# Patient Record
Sex: Male | Born: 1985 | Race: White | Hispanic: No | Marital: Married | Smoking: Never smoker
Health system: Southern US, Community
[De-identification: ages and names within clinical notes are randomized; demographics above are authoritative.]

## PROBLEM LIST (undated history)

## (undated) DIAGNOSIS — F32A Depression, unspecified: Secondary | ICD-10-CM

## (undated) DIAGNOSIS — F329 Major depressive disorder, single episode, unspecified: Secondary | ICD-10-CM

## (undated) HISTORY — PX: CHOLECYSTECTOMY: SHX55

---

## 2014-03-06 ENCOUNTER — Ambulatory Visit (INDEPENDENT_AMBULATORY_CARE_PROVIDER_SITE_OTHER): Payer: BC Managed Care – PPO | Admitting: Internal Medicine

## 2014-03-06 ENCOUNTER — Encounter: Payer: Self-pay | Admitting: Internal Medicine

## 2014-03-06 VITALS — BP 110/76 | HR 76 | Temp 98.5°F | Ht 71.75 in | Wt 278.2 lb

## 2014-03-06 DIAGNOSIS — Z0000 Encounter for general adult medical examination without abnormal findings: Secondary | ICD-10-CM

## 2014-03-06 DIAGNOSIS — E669 Obesity, unspecified: Secondary | ICD-10-CM

## 2014-03-06 LAB — LIPID PANEL
Cholesterol: 202 mg/dL — ABNORMAL HIGH (ref 0–200)
HDL: 42.2 mg/dL (ref >39.00)
LDL Cholesterol: 135 mg/dL — ABNORMAL HIGH (ref 0–99)
Total CHOL/HDL Ratio: 5
Triglycerides: 126 mg/dL (ref 0.0–149.0)
VLDL: 25.2 mg/dL (ref 0.0–40.0)

## 2014-03-06 LAB — CBC
HCT: 50.2 % (ref 39.0–52.0)
HEMOGLOBIN: 17 g/dL (ref 13.0–17.0)
MCHC: 33.8 g/dL (ref 30.0–36.0)
MCV: 88.8 fl (ref 78.0–100.0)
PLATELETS: 177 10*3/uL (ref 150.0–400.0)
RBC: 5.65 Mil/uL (ref 4.22–5.81)
RDW: 12.8 % (ref 11.5–14.6)
WBC: 10.4 10*3/uL (ref 4.5–10.5)

## 2014-03-06 LAB — COMPREHENSIVE METABOLIC PANEL
ALBUMIN: 4.3 g/dL (ref 3.5–5.2)
ALT: 34 U/L (ref 0–53)
AST: 20 U/L (ref 0–37)
Alkaline Phosphatase: 76 U/L (ref 39–117)
BUN: 11 mg/dL (ref 6–23)
CALCIUM: 9.5 mg/dL (ref 8.4–10.5)
CO2: 26 meq/L (ref 19–32)
CREATININE: 1 mg/dL (ref 0.4–1.5)
Chloride: 107 mEq/L (ref 96–112)
GFR: 93.4 mL/min (ref >60.00)
Glucose, Bld: 66 mg/dL — ABNORMAL LOW (ref 70–99)
Potassium: 4.1 mEq/L (ref 3.5–5.1)
Sodium: 139 mEq/L (ref 135–145)
Total Bilirubin: 1.4 mg/dL — ABNORMAL HIGH (ref 0.3–1.2)
Total Protein: 7.4 g/dL (ref 6.0–8.3)

## 2014-03-06 LAB — TSH: TSH: 0.7 u[IU]/mL (ref 0.35–5.50)

## 2014-03-06 LAB — HEMOGLOBIN A1C: HEMOGLOBIN A1C: 5.3 % (ref 4.6–6.5)

## 2014-03-06 NOTE — Progress Notes (Signed)
Pt presents to the clinic today to establish care. He recently moved from [place]. He has not had a PCP in the last few years.  1- He reports that he tends to get sick with some sort of illness, viral, diarrhea, strep throat multiple times per year. He is not sure why he has such a hard time fighting off infections.  Flu: occasionally none in 2014 Tetanus: 2010? Dentist: as needed  History reviewed. No pertinent past medical history.  No current outpatient prescriptions on file.   No current facility-administered medications for this visit.    No Known Allergies  Family History  Problem Relation Age of Onset  . Cancer Maternal Grandmother     breast  . Cancer Maternal Grandfather     prostate  . Diabetes Neg Hx   . Heart disease Neg Hx   . Stroke Neg Hx     History   Social History  . Marital Status: Married    Spouse Name: N/A    Number of Children: N/A  . Years of Education: N/A   Occupational History  . Not on file.   Social History Main Topics  . Smoking status: Never Smoker   . Smokeless tobacco: Never Used  . Alcohol Use: No  . Drug Use: No  . Sexual Activity: Not on file   Other Topics Concern  . Not on file   Social History Narrative  . No narrative on file    ROS:  Constitutional: Denies fever, malaise, fatigue, headache or abrupt weight changes.  HEENT: Denies eye pain, eye redness, ear pain, ringing in the ears, wax buildup, runny nose, nasal congestion, bloody nose, or sore throat. Respiratory: Denies difficulty breathing, shortness of breath, cough or sputum production.   Cardiovascular: Denies chest pain, chest tightness, palpitations or swelling in the hands or feet.  Gastrointestinal: Denies abdominal pain, bloating, constipation, diarrhea or blood in the stool.  GU: Denies frequency, urgency, pain with urination, blood in urine, odor or discharge. Musculoskeletal: Denies decrease in range of motion, difficulty with gait, muscle pain  or joint pain and swelling.  Skin: Denies redness, rashes, lesions or ulcercations.  Neurological: Denies dizziness, difficulty with memory, difficulty with speech or problems with balance and coordination.   No other specific complaints in a complete review of systems (except as listed in HPI above).  PE:  BP 110/76  Pulse 76  Temp(Src) 98.5 F (36.9 C) (Oral)  Ht 5' 11.75" (1.822 m)  Wt 278 lb 4 oz (126.213 kg)  BMI 38.02 kg/m2 Wt Readings from Last 3 Encounters:  03/06/14 278 lb 4 oz (126.213 kg)    General: Appears his stated age, obese but well developed, well nourished in NAD. HEENT: Head: normal shape and size; Eyes: sclera white, no icterus, conjunctiva pink, PERRLA and EOMs intact; Ears: Tm's gray and intact, normal light reflex; Nose: mucosa pink and moist, septum midline; Throat/Mouth: Teeth present, mucosa pink and moist, no lesions or ulcerations noted.  Neck: Normal range of motion. Neck supple, trachea midline. No massses, lumps or thyromegaly present.  Cardiovascular: Normal rate and rhythm. S1,S2 noted.  No murmur, rubs or gallops noted. No JVD or BLE edema. No carotid bruits noted. Pulmonary/Chest: Normal effort and positive vesicular breath sounds. No respiratory distress. No wheezes, rales or ronchi noted.  Abdomen: Soft and nontender. Normal bowel sounds, no bruits noted. No distention or masses noted. Liver, spleen and kidneys non palpable. Musculoskeletal: Normal range of motion. No signs of joint swelling. No  difficulty with gait.  Neurological: Alert and oriented. Cranial nerves II-XII intact. Coordination normal. +DTRs bilaterally. Psychiatric: Mood and affect normal. Behavior is normal. Judgment and thought content normal.      Assessment and Plan:  Prevent Health Maintenance:  He declines flu shot today Will check basic screening labs Encouraged him to visit a dentist on a more frequent basis  RTC in 1 year or sooner if needed

## 2014-03-06 NOTE — Patient Instructions (Addendum)

## 2014-03-06 NOTE — Assessment & Plan Note (Signed)
Encouraged him to work on diet and exercise 

## 2014-03-06 NOTE — Progress Notes (Signed)
Pre visit review using our clinic review tool, if applicable. No additional management support is needed unless otherwise documented below in the visit note. 

## 2014-03-21 ENCOUNTER — Ambulatory Visit: Payer: Self-pay | Admitting: Family Medicine

## 2014-03-21 ENCOUNTER — Telehealth: Payer: Self-pay

## 2014-03-21 NOTE — Telephone Encounter (Signed)
Pt was seen 03/06/14 and advised to cb if had further abd pain; pt said last night developed sharp rt sided abd pain under ribcage that comes and goes; continuing pain this morning. Pain level now is 6 but when has severe pain; pain level is 8-9. No nausea or vomiting and pt is not sure if has fever or not. Pt said Nicki Reaperegina Baity NP had mentioned if continued pain might have US done. Pt request cb ASAP. If condition changes or worsens prior to cb pt will cb or go to UC.

## 2014-03-21 NOTE — Telephone Encounter (Signed)
Ok, he needs to follow up and at that time we can order ultrasound

## 2014-03-21 NOTE — Telephone Encounter (Signed)
Ok either way, he needs to follow up if it is still a concern. If he brings the report that would be helpful

## 2014-03-21 NOTE — Telephone Encounter (Signed)
Pt states he went to UC at [facility] and they ordered the Abd US and they told pt that it did shown inflammation of his gall bladder but no stones--they are awaiting actual report--

## 2014-04-14 ENCOUNTER — Ambulatory Visit: Payer: Self-pay | Admitting: Surgery

## 2014-04-17 LAB — PATHOLOGY REPORT

## 2014-08-31 ENCOUNTER — Encounter: Payer: Self-pay | Admitting: Internal Medicine

## 2014-08-31 ENCOUNTER — Ambulatory Visit (INDEPENDENT_AMBULATORY_CARE_PROVIDER_SITE_OTHER): Payer: BC Managed Care – PPO | Admitting: Internal Medicine

## 2014-08-31 VITALS — BP 122/84 | HR 74 | Temp 98.1°F | Wt 283.0 lb

## 2014-08-31 DIAGNOSIS — R05 Cough: Secondary | ICD-10-CM

## 2014-08-31 DIAGNOSIS — J069 Acute upper respiratory infection, unspecified: Secondary | ICD-10-CM

## 2014-08-31 DIAGNOSIS — R059 Cough, unspecified: Secondary | ICD-10-CM

## 2014-08-31 MED ORDER — HYDROCODONE-HOMATROPINE 5-1.5 MG/5ML PO SYRP: 5.0000 mL | ORAL_SOLUTION | Freq: Three times a day (TID) | ORAL | Status: DC | PRN

## 2014-08-31 NOTE — Progress Notes (Signed)
Subjective:    Patient ID: William Cruz, male    DOB: [birth year], 28 y.o.   MRN: [id number]  HPI  Patient presents with sore throat and productive cough with thick green sputum for the past three days. He has taken ibuprofen with some relief. He reports possible history of seasonal allergies.  Review of Systems   History reviewed. No pertinent past medical history.  Current Outpatient Prescriptions  Medication Sig Dispense Refill  . HYDROcodone-homatropine (HYCODAN) 5-1.5 MG/5ML syrup Take 5 mLs by mouth every 8 (eight) hours as needed for cough.  120 mL  0   No current facility-administered medications for this visit.    No Known Allergies  Family History  Problem Relation Age of Onset  . Cancer Maternal Grandmother     breast  . Cancer Maternal Grandfather     prostate  . Diabetes Neg Hx   . Heart disease Neg Hx   . Stroke Neg Hx     History   Social History  . Marital Status: Married    Spouse Name: N/A    Number of Children: N/A  . Years of Education: N/A   Occupational History  . Not on file.   Social History Main Topics  . Smoking status: Never Smoker   . Smokeless tobacco: Never Used  . Alcohol Use: No  . Drug Use: Yes  . Sexual Activity: Not on file   Other Topics Concern  . Not on file   Social History Narrative  . No narrative on file     Constitutional: Denies fever, malaise, fatigue,  or abrupt weight changes.  HEENT: Denies eye pain, eye redness, ear pain, ringing in the ears, wax buildup, runny nose, nasal congestion, bloody nose, or . Respiratory: Denies difficulty breathing, shortness of breath, .   Cardiovascular: Denies chest pain, chest tightness, palpitations or swelling in the hands or feet.     No other specific complaints in a complete review of systems (except as listed in HPI above).     Objective:   Physical Exam  BP 122/84  Pulse 74  Temp(Src) 98.1 F (36.7 C) (Oral)  Wt 283 lb (128.368 kg)  SpO2 98% Wt Readings  from Last 3 Encounters:  08/31/14 283 lb (128.368 kg)  03/06/14 278 lb 4 oz (126.213 kg)    General: Appears his stated age, well developed, well nourished in NAD. Marland KitchenHEENT: Ears: Tm's gray and intact, normal light reflex; both ear canals erythematous.Nose: mucosa pink and moist, septum midline; Throat/Mouth: Pharynx is erythematous.  Neck: Supple, no lymphadenopathy noted.  Cardiovascular: Normal rate and rhythm. S1,S2 noted.  No murmur, rubs or gallops noted.  Pulmonary/Chest: Normal effort and positive vesicular breath sounds. No respiratory distress. No wheezes, rales or ronchi noted.   BMET    Component Value Date/Time   NA 139 03/06/2014 1123   K 4.1 03/06/2014 1123   CL 107 03/06/2014 1123   CO2 26 03/06/2014 1123   GLUCOSE 66* 03/06/2014 1123   BUN 11 03/06/2014 1123   CREATININE 1.0 03/06/2014 1123   CALCIUM 9.5 03/06/2014 1123    Lipid Panel     Component Value Date/Time   CHOL 202* 03/06/2014 1123   TRIG 126.0 03/06/2014 1123   HDL 42.20 03/06/2014 1123   CHOLHDL 5 03/06/2014 1123   VLDL 25.2 03/06/2014 1123   LDLCALC 135* 03/06/2014 1123    CBC    Component Value Date/Time   WBC 10.4 03/06/2014 1123   RBC 5.65 03/06/2014 1123  HGB 17.0 03/06/2014 1123   HCT 50.2 03/06/2014 1123   PLT 177.0 03/06/2014 1123   MCV 88.8 03/06/2014 1123   MCHC 33.8 03/06/2014 1123   RDW 12.8 03/06/2014 1123    Hgb A1C Lab Results  Component Value Date   HGBA1C 5.3 03/06/2014         Assessment & Plan:  Cough - Plan: HYDROcodone-homatropine (HYCODAN) 5-1.5 MG/5ML syrup  Viral URI  Zyrtec, Flonase, Mucinex Plenty of liquids Please call if symptoms worsen or do not improve.

## 2014-08-31 NOTE — Patient Instructions (Signed)
Cough, Adult  A cough is a reflex that helps clear your throat and airways. It can help heal the body or may be a reaction to an irritated airway. A cough may only last 2 or 3 weeks (acute) or may last more than 8 weeks (chronic).  CAUSES Acute cough:  Viral or bacterial infections. Chronic cough:  Infections.  Allergies.  Asthma.  Post-nasal drip.  Smoking.  Heartburn or acid reflux.  Some medicines.  Chronic lung problems (COPD).  Cancer. SYMPTOMS   Cough.  Fever.  Chest pain.  Increased breathing rate.  High-pitched whistling sound when breathing (wheezing).  Colored mucus that you cough up (sputum). TREATMENT   A bacterial cough may be treated with antibiotic medicine.  A viral cough must run its course and will not respond to antibiotics.  Your caregiver may recommend other treatments if you have a chronic cough. HOME CARE INSTRUCTIONS   Only take over-the-counter or prescription medicines for pain, discomfort, or fever as directed by your caregiver. Use cough suppressants only as directed by your caregiver.  Use a cold steam vaporizer or humidifier in your bedroom or home to help loosen secretions.  Sleep in a semi-upright position if your cough is worse at night.  Rest as needed.  Stop smoking if you smoke. SEEK IMMEDIATE MEDICAL CARE IF:   You have pus in your sputum.  Your cough starts to worsen.  You cannot control your cough with suppressants and are losing sleep.  You begin coughing up blood.  You have difficulty breathing.  You develop pain which is getting worse or is uncontrolled with medicine.  You have a fever. MAKE SURE YOU:   Understand these instructions.  Will watch your condition.  Will get help right away if you are not doing well or get worse. Document Released: 05/30/2011 Document Revised: 02/23/2012 Document Reviewed: 05/30/2011 ExitCare Patient Information 2015 [employer]. This information is not intended  to replace advice given to you by your health care provider. Make sure you discuss any questions you have with your health care provider.  

## 2014-08-31 NOTE — Progress Notes (Signed)
HPI  Pt presents to the clinic today with c/o sore throat, cough and headache. He reports this started 3 days ago. He has had some difficulty swallowing. The cough is productive of thick green mucous. The cough is keeping him up in the past. He denies fever, chills or body aches. He has tried Ibuprofen OTC without much relief. He has no history of seasonal allergies or breathing problems. He has  had sick contacts at work. He does not smoke  Review of Systems     No past medical history on file.  Family History  Problem Relation Age of Onset  . Cancer Maternal Grandmother     breast  . Cancer Maternal Grandfather     prostate  . Diabetes Neg Hx   . Heart disease Neg Hx   . Stroke Neg Hx     History   Social History  . Marital Status: Married    Spouse Name: N/A    Number of Children: N/A  . Years of Education: N/A   Occupational History  . Not on file.   Social History Main Topics  . Smoking status: Never Smoker   . Smokeless tobacco: Never Used  . Alcohol Use: No  . Drug Use: No  . Sexual Activity: Not on file   Other Topics Concern  . Not on file   Social History Narrative  . No narrative on file    No Known Allergies   Constitutional: Positive headache. Denies fatigue, fever or abrupt weight changes.  HEENT:  Positive sore throat. Denies eye redness, eye pain, pressure behind the eyes, facial pain, nasal congestion, ear pain, ringing in the ears, wax buildup, runny nose or bloody nose. Respiratory: Positive cough. Denies difficulty breathing or shortness of breath.  Cardiovascular: Denies chest pain, chest tightness, palpitations or swelling in the hands or feet.   No other specific complaints in a complete review of systems (except as listed in HPI above).  Objective:  BP 122/84  Pulse 74  Temp(Src) 98.1 F (36.7 C) (Oral)  Wt 283 lb (128.368 kg)  SpO2 98%  Wt Readings from Last 3 Encounters:  08/31/14 283 lb (128.368 kg)  03/06/14 278 lb 4 oz  (126.213 kg)     General: Appears his stated age, well developed, well nourished in NAD. HEENT:  Ears: Tm's red but intact, normal light reflex; Nose: mucosa pink and moist, septum midline; Throat/Mouth:  Teeth present, mucosa erythematous and moist, no exudate noted, no lesions or ulcerations noted.  Cardiovascular: Normal rate and rhythm. S1,S2 noted.  No murmur, rubs or gallops noted.  Pulmonary/Chest: Normal effort and positive vesicular breath sounds. No respiratory distress. No wheezes, rales or ronchi noted.      Assessment & Plan:   Viral Upper Respiratory Infection:  Get some rest and drink plenty of water Do salt water gargles and continue Ibuprofen for the sore throat Take Claritin, Flonase and Mucinex OTC eRx for Hycodan cough syrup QHS  RTC as needed or if symptoms persist.

## 2014-08-31 NOTE — Progress Notes (Signed)
Pre visit review using our clinic review tool, if applicable. No additional management support is needed unless otherwise documented below in the visit note. 

## 2015-04-07 NOTE — Op Note (Signed)
PATIENT NAME:  William Cruz, William Cruz MR#:  [id number] DATE OF BIRTH:  03-26-1986  DATE OF PROCEDURE:  04/14/2014  PREOPERATIVE DIAGNOSIS: Chronic cholecystitis.   POSTOPERATIVE DIAGNOSIS: Chronic cholecystitis.   PROCEDURE: Laparoscopic cholecystectomy, cholangiogram.   SURGEON: Adella Hare, MD  ANESTHESIA: General.   INDICATIONS: This 29 year old male has a history of right upper abdominal pain. He had an abnormal ultrasound and the gallbladder was partially contracted and had thickening of the gallbladder wall at 3.8 mm, and surgery was recommended for definitive treatment.   DESCRIPTION OF PROCEDURE: The patient was placed on the operating table in the supine position under general endotracheal anesthesia. The abdomen was prepared with ChloraPrep and draped in a sterile manner.   A short incision was made in the inferior aspect of the umbilicus and carried down to the deep fascia, which was grasped with laryngeal hook and elevated. A Veress William Cruz was inserted, aspirated and irrigated with a saline solution. Next, the peritoneal cavity was inflated with carbon dioxide. The Veress William Cruz was removed. The 10 mm cannula was inserted. The 10 mm 0 degree laparoscope was inserted to view the peritoneal cavity. The liver appeared smooth. Most of what could be seen at this point was omentum. Next, another incision was made in the epigastrium, slightly to the right of the midline to introduce an 11 mm cannula. Two incisions were made in the lateral aspect of the right upper quadrant. The lowermost incision at this site had some bleeding, and this bleeding point was cauterized. Two 5 mm cannulas were inserted.   With the patient in the reverse Trendelenburg position turned several degrees to the left, the gallbladder was grasped with 5 mm grasper. There were adhesions surrounding the gallbladder, which were taken down with blunt and sharp dissection, and it appeared that with the distance from the umbilicus  to the gallbladder, visibility at this point was suboptimal and elected to change to the 25 degree scope, which the gallbladder could be seen better as the omentum was dissected away from the gallbladder. The gallbladder was also encased in fat, and there was a somewhat tedious dissection undertaken to incise and peel the fat off of the gallbladder. Next, the gallbladder neck was grasped with a 5 mm grasper and retracted inferiorly and laterally. The location of the porta hepatis was demonstrated. The cystic duct was dissected free from surrounding structures. The cystic artery was dissected free from surrounding structures. The gallbladder neck was mobilized further with incision of the visceral peritoneum. A critical view of safety was demonstrated. An Endo Clip was placed across the cystic duct adjacent to the neck of the gallbladder. An incision was made in the cystic duct, which there was some bleeding which was cauterized. Subsequently, the Reddick catheter was inserted, and the cholangiogram was done with injection of half-strength Conray 60 dye. The cholangiogram demonstrated somewhat small bile ducts. There was reflux into the liver, and there was flow into the duodenum. No retained stones were seen. The Reddick catheter was removed. The cystic duct was doubly ligated with Endo Clips and divided. The cystic artery was controlled with Endo Clips and divided. The gallbladder was dissected free from the liver with use of hook and cautery and blunt dissection. There was another vessel along the posterior aspect of the gallbladder bed which was controlled with a clip. The gallbladder was further mobilized from the liver and completely separated. A small amount of blood was aspirated. Hemostasis was intact. The gallbladder was brought up through the infraumbilical  incision, opened and suctioned, removed and submitted in formalin for routine pathology. The right upper quadrant was further inspected. Hemostasis was  intact. The cannulas were removed, allowing carbon dioxide to escape from the peritoneal cavity. Skin incisions were closed with interrupted 5-0 chromic subcuticular sutures, benzoin and Steri-Strips. Dressings were applied with paper tape. The patient tolerated surgery satisfactorily and was then prepared for transfer to the recovery room.  ____________________________ Shela CommonsJ. Renda RollsWilton Smith, MD jws:lb D: 04/14/2014 09:31:16 ET T: 04/14/2014 09:41:02 ET JOB#: [id number]  cc: Adella HareJ. Wilton Smith, MD, <Dictator> Adella HareWILTON J SMITH MD ELECTRONICALLY SIGNED 04/21/2014 12:50

## 2015-12-04 ENCOUNTER — Ambulatory Visit (INDEPENDENT_AMBULATORY_CARE_PROVIDER_SITE_OTHER): Payer: BLUE CROSS/BLUE SHIELD | Admitting: Internal Medicine

## 2015-12-04 ENCOUNTER — Ambulatory Visit
Admission: RE | Admit: 2015-12-04 | Discharge: 2015-12-04 | Disposition: A | Discharge status: Home / Self Care | Payer: BLUE CROSS/BLUE SHIELD | Source: Ambulatory Visit | Attending: Internal Medicine | Admitting: Internal Medicine

## 2015-12-04 ENCOUNTER — Encounter: Payer: Self-pay | Admitting: Internal Medicine

## 2015-12-04 VITALS — BP 122/72 | HR 72 | Temp 98.4°F | Wt 286.0 lb

## 2015-12-04 DIAGNOSIS — M545 Low back pain, unspecified: Secondary | ICD-10-CM

## 2015-12-04 MED ORDER — METHOCARBAMOL 500 MG PO TABS: 500.0000 mg | ORAL_TABLET | Freq: Every evening | ORAL | Status: DC | PRN
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
## 2015-12-04 NOTE — Patient Instructions (Signed)

## 2015-12-04 NOTE — Progress Notes (Signed)
Pre visit review using our clinic review tool, if applicable. No additional management support is needed unless otherwise documented below in the visit note. 

## 2015-12-19 ENCOUNTER — Emergency Department: Payer: BLUE CROSS/BLUE SHIELD

## 2015-12-19 ENCOUNTER — Emergency Department
Admission: EM | Admit: 2015-12-19 | Discharge: 2015-12-19 | Disposition: A | Discharge status: Home / Self Care | Payer: BLUE CROSS/BLUE SHIELD | Attending: Student | Admitting: Student

## 2015-12-19 ENCOUNTER — Telehealth: Payer: Self-pay | Admitting: Internal Medicine

## 2015-12-19 ENCOUNTER — Other Ambulatory Visit: Payer: Self-pay

## 2015-12-19 DIAGNOSIS — Y9389 Activity, other specified: Secondary | ICD-10-CM | POA: Insufficient documentation

## 2015-12-19 DIAGNOSIS — S0990XA Unspecified injury of head, initial encounter: Secondary | ICD-10-CM | POA: Diagnosis not present

## 2015-12-19 DIAGNOSIS — Y92410 Unspecified street and highway as the place of occurrence of the external cause: Secondary | ICD-10-CM | POA: Diagnosis not present

## 2015-12-19 DIAGNOSIS — Y998 Other external cause status: Secondary | ICD-10-CM | POA: Diagnosis not present

## 2015-12-19 DIAGNOSIS — R55 Syncope and collapse: Secondary | ICD-10-CM | POA: Diagnosis not present

## 2015-12-19 DIAGNOSIS — S199XXA Unspecified injury of neck, initial encounter: Secondary | ICD-10-CM | POA: Insufficient documentation

## 2015-12-19 LAB — URINALYSIS COMPLETE WITH MICROSCOPIC (ARMC ONLY)
BILIRUBIN URINE: NEGATIVE
Bacteria, UA: NONE SEEN
GLUCOSE, UA: NEGATIVE mg/dL
Ketones, ur: NEGATIVE mg/dL
LEUKOCYTES UA: NEGATIVE
Nitrite: NEGATIVE
Protein, ur: NEGATIVE mg/dL
pH: 5 (ref 5.0–8.0)

## 2015-12-19 LAB — BASIC METABOLIC PANEL
Anion gap: 7 (ref 5–15)
BUN: 11 mg/dL (ref 6–20)
CHLORIDE: 109 mmol/L (ref 101–111)
CO2: 24 mmol/L (ref 22–32)
Calcium: 9.3 mg/dL (ref 8.9–10.3)
Creatinine, Ser: 0.9 mg/dL (ref 0.61–1.24)
GFR calc non Af Amer: >60 mL/min (ref >60)
Glucose, Bld: 95 mg/dL (ref 65–99)
Potassium: 4.2 mmol/L (ref 3.5–5.1)
SODIUM: 140 mmol/L (ref 135–145)

## 2015-12-19 LAB — TROPONIN I

## 2015-12-19 LAB — GLUCOSE, CAPILLARY: Glucose-Capillary: 84 mg/dL (ref 65–99)

## 2015-12-19 LAB — CBC
HEMATOCRIT: 49.7 % (ref 40.0–52.0)
HEMOGLOBIN: 16.8 g/dL (ref 13.0–18.0)
MCH: 28.8 pg (ref 26.0–34.0)
MCHC: 33.7 g/dL (ref 32.0–36.0)
MCV: 85.3 fL (ref 80.0–100.0)
Platelets: 182 10*3/uL (ref 150–440)
RBC: 5.83 MIL/uL (ref 4.40–5.90)
RDW: 13.1 % (ref 11.5–14.5)
WBC: 9.8 10*3/uL (ref 3.8–10.6)

## 2015-12-19 MED ORDER — SODIUM CHLORIDE 0.9 % IV BOLUS (SEPSIS)
500.0000 mL | Freq: Once | INTRAVENOUS | Status: AC
  Administered 2015-12-19: 500 mL via INTRAVENOUS

## 2015-12-19 MED ORDER — SODIUM CHLORIDE 0.9 % IV BOLUS (SEPSIS)
1000.0000 mL | Freq: Once | INTRAVENOUS | Status: AC
Start: 2015-12-19 — End: 2015-12-19
  Administered 2015-12-19: 1000 mL via INTRAVENOUS

## 2015-12-19 MED ORDER — IOHEXOL 350 MG/ML SOLN
100.0000 mL | Freq: Once | INTRAVENOUS | Status: AC | PRN
  Administered 2015-12-19: 80 mL via INTRAVENOUS

## 2015-12-19 NOTE — Telephone Encounter (Signed)
Per chart review pt is at [facility] ED now. 
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
## 2015-12-19 NOTE — ED Notes (Signed)
Patient transported to X-ray 

## 2016-01-22 ENCOUNTER — Encounter: Payer: Self-pay | Admitting: Internal Medicine

## 2016-01-22 ENCOUNTER — Ambulatory Visit (INDEPENDENT_AMBULATORY_CARE_PROVIDER_SITE_OTHER): Payer: BLUE CROSS/BLUE SHIELD | Admitting: Internal Medicine

## 2016-01-22 VITALS — BP 124/86 | HR 86 | Temp 98.0°F | Wt 287.0 lb

## 2016-01-22 DIAGNOSIS — F32A Depression, unspecified: Secondary | ICD-10-CM

## 2016-01-22 DIAGNOSIS — F329 Major depressive disorder, single episode, unspecified: Secondary | ICD-10-CM | POA: Diagnosis not present

## 2016-01-22 MED ORDER — ESCITALOPRAM OXALATE 10 MG PO TABS: 10.0000 mg | ORAL_TABLET | Freq: Every day | ORAL | Status: DC

## 2016-01-22 NOTE — Patient Instructions (Signed)
Major Depressive Disorder Major depressive disorder is a mental illness. It also may be called clinical depression or unipolar depression. Major depressive disorder usually causes feelings of sadness, hopelessness, or helplessness. Some people with this disorder do not feel particularly sad but lose interest in doing things they used to enjoy (anhedonia). Major depressive disorder also can cause physical symptoms. It can interfere with work, school, relationships, and other normal everyday activities. The disorder varies in severity but is longer lasting and more serious than the sadness we all feel from time to time in our lives. Major depressive disorder often is triggered by stressful life events or major life changes. Examples of these triggers include divorce, loss of your job or home, a move, and the death of a family member or close friend. Sometimes this disorder occurs for no obvious reason at all. People who have family members with major depressive disorder or bipolar disorder are at higher risk for developing this disorder, with or without life stressors. Major depressive disorder can occur at any age. It may occur just once in your life (single episode major depressive disorder). It may occur multiple times (recurrent major depressive disorder). SYMPTOMS People with major depressive disorder have either anhedonia or depressed mood on nearly a daily basis for at least 2 weeks or longer. Symptoms of depressed mood include:  Feelings of sadness (blue or down in the dumps) or emptiness.  Feelings of hopelessness or helplessness.  Tearfulness or episodes of crying (may be observed by others).  Irritability (children and adolescents). In addition to depressed mood or anhedonia or both, people with this disorder have at least four of the following symptoms:  Difficulty sleeping or sleeping too much.   Significant change (increase or decrease) in appetite or weight.   Lack of energy or  motivation.  Feelings of guilt and worthlessness.   Difficulty concentrating, remembering, or making decisions.  Unusually slow movement (psychomotor retardation) or restlessness (as observed by others).   Recurrent wishes for death, recurrent thoughts of self-harm (suicide), or a suicide attempt. People with major depressive disorder commonly have persistent negative thoughts about themselves, other people, and the world. People with severe major depressive disorder may experiencedistorted beliefs or perceptions about the world (psychotic delusions). They also may see or hear things that are not real (psychotic hallucinations). DIAGNOSIS Major depressive disorder is diagnosed through an assessment by your health care provider. Your health care provider will ask aboutaspects of your daily life, such as mood,sleep, and appetite, to see if you have the diagnostic symptoms of major depressive disorder. Your health care provider may ask about your medical history and use of alcohol or drugs, including prescription medicines. Your health care provider also may do a physical exam and blood work. This is because certain medical conditions and the use of certain substances can cause major depressive disorder-like symptoms (secondary depression). Your health care provider also may refer you to a mental health specialist for further evaluation and treatment. TREATMENT It is important to recognize the symptoms of major depressive disorder and seek treatment. The following treatments can be prescribed for this disorder:   Medicine. Antidepressant medicines usually are prescribed. Antidepressant medicines are thought to correct chemical imbalances in the brain that are commonly associated with major depressive disorder. Other types of medicine may be added if the symptoms do not respond to antidepressant medicines alone or if psychotic delusions or hallucinations occur.  Talk therapy. Talk therapy can be  helpful in treating major depressive disorder by providing   support, education, and guidance. Certain types of talk therapy also can help with negative thinking (cognitive behavioral therapy) and with relationship issues that trigger this disorder (interpersonal therapy). A mental health specialist can help determine which treatment is best for you. Most people with major depressive disorder do well with a combination of medicine and talk therapy. Treatments involving electrical stimulation of the brain can be used in situations with extremely severe symptoms or when medicine and talk therapy do not work over time. These treatments include electroconvulsive therapy, transcranial magnetic stimulation, and vagal nerve stimulation.   This information is not intended to replace advice given to you by your health care provider. Make sure you discuss any questions you have with your health care provider.   Document Released: 03/28/2013 Document Revised: 12/22/2014 Document Reviewed: 03/28/2013 Elsevier Interactive Patient Education 2016 [employer] Inc.  
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
## 2016-01-22 NOTE — Progress Notes (Signed)
Pre visit review using our clinic review tool, if applicable. No additional management support is needed unless otherwise documented below in the visit note. 

## 2016-01-23 ENCOUNTER — Telehealth: Payer: Self-pay

## 2016-01-23 NOTE — Telephone Encounter (Signed)
pts wife (DPR signed) left v/m requesting cb about med that was prescribed on 01/22/16. Left v/m requesting cb.

## 2016-02-13 NOTE — Telephone Encounter (Signed)
Lessie Dings said at this time pt is seeing another doctor as well and that doctor is reviewing med; if anything further needed Lessie Dings will cb.

## 2016-02-19 ENCOUNTER — Ambulatory Visit (INDEPENDENT_AMBULATORY_CARE_PROVIDER_SITE_OTHER): Payer: BLUE CROSS/BLUE SHIELD | Admitting: Internal Medicine

## 2016-02-19 ENCOUNTER — Telehealth: Payer: Self-pay | Admitting: Internal Medicine

## 2016-02-19 ENCOUNTER — Encounter: Payer: Self-pay | Admitting: Internal Medicine

## 2016-02-19 ENCOUNTER — Encounter (HOSPITAL_COMMUNITY): Payer: Self-pay | Admitting: *Deleted

## 2016-02-19 VITALS — BP 124/78 | HR 77 | Temp 97.9°F | Wt 287.0 lb

## 2016-02-19 DIAGNOSIS — Z79899 Other long term (current) drug therapy: Secondary | ICD-10-CM | POA: Insufficient documentation

## 2016-02-19 DIAGNOSIS — F22 Delusional disorders: Secondary | ICD-10-CM

## 2016-02-19 DIAGNOSIS — R45851 Suicidal ideations: Secondary | ICD-10-CM | POA: Diagnosis present

## 2016-02-19 DIAGNOSIS — F329 Major depressive disorder, single episode, unspecified: Secondary | ICD-10-CM | POA: Insufficient documentation

## 2016-02-19 DIAGNOSIS — F32A Depression, unspecified: Secondary | ICD-10-CM

## 2016-02-19 LAB — CBC
HCT: 46.9 % (ref 39.0–52.0)
HEMOGLOBIN: 16.2 g/dL (ref 13.0–17.0)
MCH: 29.9 pg (ref 26.0–34.0)
MCHC: 34.5 g/dL (ref 30.0–36.0)
MCV: 86.5 fL (ref 78.0–100.0)
PLATELETS: 187 10*3/uL (ref 150–400)
RBC: 5.42 MIL/uL (ref 4.22–5.81)
RDW: 12.6 % (ref 11.5–15.5)
WBC: 11 10*3/uL — AB (ref 4.0–10.5)

## 2016-02-19 LAB — COMPREHENSIVE METABOLIC PANEL
ALBUMIN: 3.7 g/dL (ref 3.5–5.0)
ALT: 25 U/L (ref 17–63)
AST: 19 U/L (ref 15–41)
Alkaline Phosphatase: 77 U/L (ref 38–126)
Anion gap: 10 (ref 5–15)
BUN: 10 mg/dL (ref 6–20)
CHLORIDE: 108 mmol/L (ref 101–111)
CO2: 23 mmol/L (ref 22–32)
Calcium: 9.2 mg/dL (ref 8.9–10.3)
Creatinine, Ser: 1.03 mg/dL (ref 0.61–1.24)
Glucose, Bld: 125 mg/dL — ABNORMAL HIGH (ref 65–99)
POTASSIUM: 3.7 mmol/L (ref 3.5–5.1)
SODIUM: 141 mmol/L (ref 135–145)
Total Bilirubin: 1.5 mg/dL — ABNORMAL HIGH (ref 0.3–1.2)
Total Protein: 6.7 g/dL (ref 6.5–8.1)

## 2016-02-19 LAB — RAPID URINE DRUG SCREEN, HOSP PERFORMED
AMPHETAMINES: NOT DETECTED
BENZODIAZEPINES: NOT DETECTED
Barbiturates: NOT DETECTED
COCAINE: NOT DETECTED
OPIATES: NOT DETECTED
Tetrahydrocannabinol: NOT DETECTED

## 2016-02-19 LAB — ACETAMINOPHEN LEVEL

## 2016-02-19 LAB — ETHANOL

## 2016-02-19 LAB — SALICYLATE LEVEL

## 2016-02-19 NOTE — Telephone Encounter (Signed)
Noted, will discuss at upcoming appt 

## 2016-02-19 NOTE — Patient Instructions (Signed)
Major Depressive Disorder Major depressive disorder is a mental illness. It also may be called clinical depression or unipolar depression. Major depressive disorder usually causes feelings of sadness, hopelessness, or helplessness. Some people with this disorder do not feel particularly sad but lose interest in doing things they used to enjoy (anhedonia). Major depressive disorder also can cause physical symptoms. It can interfere with work, school, relationships, and other normal everyday activities. The disorder varies in severity but is longer lasting and more serious than the sadness we all feel from time to time in our lives. Major depressive disorder often is triggered by stressful life events or major life changes. Examples of these triggers include divorce, loss of your job or home, a move, and the death of a family member or close friend. Sometimes this disorder occurs for no obvious reason at all. People who have family members with major depressive disorder or bipolar disorder are at higher risk for developing this disorder, with or without life stressors. Major depressive disorder can occur at any age. It may occur just once in your life (single episode major depressive disorder). It may occur multiple times (recurrent major depressive disorder). SYMPTOMS People with major depressive disorder have either anhedonia or depressed mood on nearly a daily basis for at least 2 weeks or longer. Symptoms of depressed mood include:  Feelings of sadness (blue or down in the dumps) or emptiness.  Feelings of hopelessness or helplessness.  Tearfulness or episodes of crying (may be observed by others).  Irritability (children and adolescents). In addition to depressed mood or anhedonia or both, people with this disorder have at least four of the following symptoms:  Difficulty sleeping or sleeping too much.   Significant change (increase or decrease) in appetite or weight.   Lack of energy or  motivation.  Feelings of guilt and worthlessness.   Difficulty concentrating, remembering, or making decisions.  Unusually slow movement (psychomotor retardation) or restlessness (as observed by others).   Recurrent wishes for death, recurrent thoughts of self-harm (suicide), or a suicide attempt. People with major depressive disorder commonly have persistent negative thoughts about themselves, other people, and the world. People with severe major depressive disorder may experiencedistorted beliefs or perceptions about the world (psychotic delusions). They also may see or hear things that are not real (psychotic hallucinations). DIAGNOSIS Major depressive disorder is diagnosed through an assessment by your health care provider. Your health care provider will ask aboutaspects of your daily life, such as mood,sleep, and appetite, to see if you have the diagnostic symptoms of major depressive disorder. Your health care provider may ask about your medical history and use of alcohol or drugs, including prescription medicines. Your health care provider also may do a physical exam and blood work. This is because certain medical conditions and the use of certain substances can cause major depressive disorder-like symptoms (secondary depression). Your health care provider also may refer you to a mental health specialist for further evaluation and treatment. TREATMENT It is important to recognize the symptoms of major depressive disorder and seek treatment. The following treatments can be prescribed for this disorder:   Medicine. Antidepressant medicines usually are prescribed. Antidepressant medicines are thought to correct chemical imbalances in the brain that are commonly associated with major depressive disorder. Other types of medicine may be added if the symptoms do not respond to antidepressant medicines alone or if psychotic delusions or hallucinations occur.  Talk therapy. Talk therapy can be  helpful in treating major depressive disorder by providing   support, education, and guidance. Certain types of talk therapy also can help with negative thinking (cognitive behavioral therapy) and with relationship issues that trigger this disorder (interpersonal therapy). A mental health specialist can help determine which treatment is best for you. Most people with major depressive disorder do well with a combination of medicine and talk therapy. Treatments involving electrical stimulation of the brain can be used in situations with extremely severe symptoms or when medicine and talk therapy do not work over time. These treatments include electroconvulsive therapy, transcranial magnetic stimulation, and vagal nerve stimulation.   This information is not intended to replace advice given to you by your health care provider. Make sure you discuss any questions you have with your health care provider.   Document Released: 03/28/2013 Document Revised: 12/22/2014 Document Reviewed: 03/28/2013 Elsevier Interactive Patient Education 2016 [employer] Inc.  

## 2016-02-19 NOTE — ED Notes (Signed)
Staffing notified of need for sitter.  

## 2016-02-19 NOTE — Progress Notes (Signed)
Subjective:    Patient ID: William Cruz, male    DOB: [birth year], 30 y.o.   MRN: [id number]  HPI  Pt presents to the clinic today for 1 month follow up of depression. He was started on Lexapro at his last visit. He reports he was slightly dizzy after he started the medication but that seemed to wear off. He reports he realized this morning that he had not taking his medication in 2 days. He was dizzy and felt nausea. [place] was under the influence. The cop called his wife and advised her to come pick him up. He thinks he is feeling this way because he forgot to take his medication. He did establish with a psychologist, Dr. Beckey Downing at [facility]. He recommended that he see their psychiatrist on staff, and  has appt set up for next week.His wife reports ongoing delusions. He still feels depressed, but does feel it is better. He denies SI/HI.  Review of Systems  History reviewed. No pertinent past medical history.  Current Outpatient Prescriptions  Medication Sig Dispense Refill  . escitalopram (LEXAPRO) 10 MG tablet Take 1 tablet (10 mg total) by mouth at bedtime. 30 tablet 2  . ibuprofen (ADVIL,MOTRIN) 200 MG tablet Take 600 mg by mouth every 6 (six) hours as needed.     No current facility-administered medications for this visit.    No Known Allergies  Family History  Problem Relation Age of Onset  . Cancer Maternal Grandmother     breast  . Cancer Maternal Grandfather     prostate  . Diabetes Neg Hx   . Heart disease Neg Hx   . Stroke Neg Hx     Social History   Social History  . Marital Status: Married    Spouse Name: N/A  . Number of Children: N/A  . Years of Education: N/A   Occupational History  . Not on file.   Social History Main Topics  . Smoking status: Never Smoker   . Smokeless tobacco: Never Used  . Alcohol Use: No  . Drug Use: Yes  . Sexual Activity: Yes   Other Topics Concern  .  Not on file   Social History Narrative     Constitutional: Denies fever, malaise, fatigue, headache or abrupt weight changes.  Respiratory: Denies difficulty breathing, shortness of breath, cough or sputum production.   Cardiovascular: Denies chest pain, chest tightness, palpitations or swelling in the hands or feet.  Neurological: Denies dizziness, difficulty with memory, difficulty with speech or problems with balance and coordination.  Psych: Pt reports depression, delusions. Denies anxiety, SI/HI.  No other specific complaints in a complete review of systems (except as listed in HPI above).     Objective:   Physical Exam  BP 124/78 mmHg  Pulse 77  Temp(Src) 97.9 F (36.6 C) (Oral)  Wt 287 lb (130.182 kg)  SpO2 98% Wt Readings from Last 3 Encounters:  02/19/16 287 lb (130.182 kg)  01/22/16 287 lb (130.182 kg)  12/19/15 284 lb (128.822 kg)    General: Appears his stated age, obese in NAD. Cardiovascular: Normal rate and rhythm. S1,S2 noted.  No murmur, rubs or gallops noted.  Pulmonary/Chest: Normal effort and positive vesicular breath sounds. No respiratory distress. No wheezes, rales or ronchi noted.  Neurological: Alert and oriented.  Psychiatric: He does seem a little paranoid today. Affect is normal.  BMET    Component Value Date/Time   NA  140 12/19/2015 0909   K 4.2 12/19/2015 0909   CL 109 12/19/2015 0909   CO2 24 12/19/2015 0909   GLUCOSE 95 12/19/2015 0909   BUN 11 12/19/2015 0909   CREATININE 0.90 12/19/2015 0909   CALCIUM 9.3 12/19/2015 0909   GFRNONAA >60 12/19/2015 0909   GFRAA >60 12/19/2015 0909    Lipid Panel     Component Value Date/Time   CHOL 202* 03/06/2014 1123   TRIG 126.0 03/06/2014 1123   HDL 42.20 03/06/2014 1123   CHOLHDL 5 03/06/2014 1123   VLDL 25.2 03/06/2014 1123   LDLCALC 135* 03/06/2014 1123    CBC    Component Value Date/Time   WBC 9.8 12/19/2015 0909   RBC 5.83 12/19/2015 0909   HGB 16.8 12/19/2015 0909   HCT 49.7  12/19/2015 0909   PLT 182 12/19/2015 0909   MCV 85.3 12/19/2015 0909   MCH 28.8 12/19/2015 0909   MCHC 33.7 12/19/2015 0909   RDW 13.1 12/19/2015 0909    Hgb A1C Lab Results  Component Value Date   HGBA1C 5.3 03/06/2014         Assessment & Plan:   Depression with delusional disorder:  Concerning for bipolar disorder He already has an appt with psychiatry Continue Lexapro until evaluated by psych Advised him if he becomes acutely delusional, psychotic or has SI/HI, to call 911 or go to nearest ER  Will follow up after psych evaluation

## 2016-02-19 NOTE — ED Notes (Signed)
Pt c/o suicidal thoughts, int x 1 month. States his plan was to overdose on muscle relaxers.

## 2016-02-19 NOTE — Progress Notes (Signed)
Pre visit review using our clinic review tool, if applicable. No additional management support is needed unless otherwise documented below in the visit note. 

## 2016-02-19 NOTE — Telephone Encounter (Signed)
Pt's wife states pt has not been taking medication---pt just called wife and he got pulled over due to speeding--pt has been nauseous, dizzy and has been missing work. Pt reports pt seems to be "out of it"--wife reports pt has been acting as if things are going on that are not truly happening---altered mental status as "dreaming" bringing into real life

## 2016-02-19 NOTE — ED Notes (Signed)
Pt normally sees [facility].

## 2016-02-19 NOTE — ED Notes (Signed)
States he was recently diagnosed with depression, takes lexapro. States this morning he realized he hadn't taken it for 2 days so he took 1 dose this morning (usually takes it at night).

## 2016-02-19 NOTE — Telephone Encounter (Signed)
William Cruz, Pt is scheduled for 345pm today. Pt wife made appt and she would like you to call her to discuss some things. She wont be able to make to appt but would like to talk to you before you see him.  Thanks

## 2016-02-20 ENCOUNTER — Telehealth: Payer: Self-pay

## 2016-02-20 ENCOUNTER — Encounter: Payer: Self-pay | Admitting: *Deleted

## 2016-02-20 ENCOUNTER — Emergency Department (HOSPITAL_COMMUNITY)
Admission: EM | Admit: 2016-02-20 | Discharge: 2016-02-20 | Disposition: A | Discharge status: Other Acute Inpatient Hospital | Payer: BLUE CROSS/BLUE SHIELD | Attending: Emergency Medicine | Admitting: Emergency Medicine

## 2016-02-20 ENCOUNTER — Inpatient Hospital Stay
Admission: AD | Admit: 2016-02-20 | Discharge: 2016-02-25 | DRG: 881 | Disposition: A | Discharge status: Home / Self Care | Payer: BLUE CROSS/BLUE SHIELD | Source: Intra-hospital | Attending: Psychiatry | Admitting: Psychiatry

## 2016-02-20 DIAGNOSIS — Z818 Family history of other mental and behavioral disorders: Secondary | ICD-10-CM | POA: Diagnosis not present

## 2016-02-20 DIAGNOSIS — F22 Delusional disorders: Secondary | ICD-10-CM | POA: Diagnosis present

## 2016-02-20 DIAGNOSIS — G4700 Insomnia, unspecified: Secondary | ICD-10-CM | POA: Diagnosis present

## 2016-02-20 DIAGNOSIS — F322 Major depressive disorder, single episode, severe without psychotic features: Secondary | ICD-10-CM | POA: Diagnosis not present

## 2016-02-20 DIAGNOSIS — F329 Major depressive disorder, single episode, unspecified: Principal | ICD-10-CM | POA: Diagnosis present

## 2016-02-20 DIAGNOSIS — Z6830 Body mass index (BMI) 30.0-30.9, adult: Secondary | ICD-10-CM | POA: Diagnosis not present

## 2016-02-20 DIAGNOSIS — Z79899 Other long term (current) drug therapy: Secondary | ICD-10-CM

## 2016-02-20 DIAGNOSIS — E669 Obesity, unspecified: Secondary | ICD-10-CM | POA: Diagnosis present

## 2016-02-20 DIAGNOSIS — R45851 Suicidal ideations: Secondary | ICD-10-CM | POA: Diagnosis present

## 2016-02-20 DIAGNOSIS — K5900 Constipation, unspecified: Secondary | ICD-10-CM | POA: Diagnosis present

## 2016-02-20 DIAGNOSIS — Z9049 Acquired absence of other specified parts of digestive tract: Secondary | ICD-10-CM

## 2016-02-20 DIAGNOSIS — F639 Impulse disorder, unspecified: Secondary | ICD-10-CM

## 2016-02-20 DIAGNOSIS — Z809 Family history of malignant neoplasm, unspecified: Secondary | ICD-10-CM

## 2016-02-20 DIAGNOSIS — F650 Fetishism: Secondary | ICD-10-CM

## 2016-02-20 DIAGNOSIS — R55 Syncope and collapse: Secondary | ICD-10-CM | POA: Diagnosis not present

## 2016-02-20 DIAGNOSIS — F321 Major depressive disorder, single episode, moderate: Secondary | ICD-10-CM | POA: Diagnosis not present

## 2016-02-20 HISTORY — DX: Major depressive disorder, single episode, unspecified: F32.9

## 2016-02-20 HISTORY — DX: Depression, unspecified: F32.A

## 2016-02-20 MED ORDER — ALUM & MAG HYDROXIDE-SIMETH 200-200-20 MG/5ML PO SUSP: 30.0000 mL | ORAL | Status: DC | PRN

## 2016-02-20 MED ORDER — ACETAMINOPHEN 325 MG PO TABS: 650.0000 mg | ORAL_TABLET | Freq: Four times a day (QID) | ORAL | Status: DC | PRN

## 2016-02-20 MED ORDER — ESCITALOPRAM OXALATE 10 MG PO TABS: 10.0000 mg | ORAL_TABLET | Freq: Every day | ORAL | Status: DC

## 2016-02-20 MED ORDER — MAGNESIUM HYDROXIDE 400 MG/5ML PO SUSP: 30.0000 mL | Freq: Every day | ORAL | Status: DC | PRN

## 2016-02-20 MED ORDER — IBUPROFEN 600 MG PO TABS
600.0000 mg | ORAL_TABLET | Freq: Four times a day (QID) | ORAL | Status: DC | PRN
  Filled 2016-02-20: qty 1

## 2016-02-20 MED ORDER — TRAZODONE HCL 100 MG PO TABS: 100.0000 mg | ORAL_TABLET | Freq: Every evening | ORAL | Status: DC | PRN

## 2016-02-20 MED ORDER — NICOTINE 21 MG/24HR TD PT24: 21.0000 mg | MEDICATED_PATCH | Freq: Every day | TRANSDERMAL | Status: DC | PRN

## 2016-02-20 NOTE — ED Notes (Signed)
[facility]

## 2016-02-20 NOTE — BH Assessment (Signed)
Patient has been accepted to [facility].  Accepting physician is Dr. Lucianne MussKumar.  Attending Physician will be Dr. Ardyth HarpsHernandez.  Patient has been assigned to room 320, by [facility] Charge Nurse Robert LeeGwen.  Call report to [phone].  Representative/Transfer Coordinator is Karess Harner.  [facility] Staff (Meghan, Careers information officerTTS/Social Worker) made aware of acceptance.

## 2016-02-20 NOTE — Telephone Encounter (Signed)
I have been following the notes in the ER. I am glad he is getting the help he needs.

## 2016-02-20 NOTE — Telephone Encounter (Signed)
William Cruz pts wife (DPR signed) pt was seen by Pamala Hurry Baity NP on 02/19/16. [facility]; pt requested wife to take to ED for admission for suicide watch; now pt is being transferred to [facility] due to availability of bed. [name] saw pt earlier for few minutes; pt seems to be OK, pt is being cooperative. Pt did not try to harm himself but was afraid he might and that prompted them to go to ED.

## 2016-02-20 NOTE — ED Notes (Signed)
TTS in room.  

## 2016-02-20 NOTE — Progress Notes (Signed)
Patient attended the wrap up group, engaging, active participant, attentive, followed the coping skills discussed by the Clinical research associatewriter.

## 2016-02-20 NOTE — ED Notes (Signed)
Pt ambulated to the restroom and made a phone call at the nurses station in a steady manner.

## 2016-02-20 NOTE — ED Provider Notes (Signed)
TIME SEEN: 5:00 AM  CHIEF COMPLAINT: Suicidal thoughts  HPI: Patient is a 30 y.o. M with history of depression who presents to the emergency department with complaints of suicidal thoughts for the past month and have been intermittent and that worsened last night. He has a planned overdose on muscle relaxers. No prior history is suicide attempts. Denies HI. Denies hallucinations. Denies drug or alcohol use. Denies any current medical complaints including fevers, cough, vomiting, diarrhea or pain.  ROS: See HPI Constitutional: no fever  Eyes: no drainage  ENT: no runny nose   Cardiovascular:  no chest pain  Resp: no SOB  GI: no vomiting GU: no dysuria Integumentary: no rash  Allergy: no hives  Musculoskeletal: no leg swelling  Neurological: no slurred speech ROS otherwise negative  PAST MEDICAL HISTORY/PAST SURGICAL HISTORY:  Past Medical History  Diagnosis Date  . Depression     MEDICATIONS:  Prior to Admission medications   Medication Sig Start Date End Date Taking? Authorizing Provider  escitalopram (LEXAPRO) 10 MG tablet Take 1 tablet (10 mg total) by mouth at bedtime. 01/22/16  Yes Lorre Munroeegina W Baity, NP  ibuprofen (ADVIL,MOTRIN) 200 MG tablet Take 600 mg by mouth every 6 (six) hours as needed for moderate pain.    Yes Historical Provider, MD  Probiotic Product (PROBIOTIC DAILY PO) Take 1 tablet by mouth daily.   Yes Historical Provider, MD    ALLERGIES:  No Known Allergies  SOCIAL HISTORY:  Social History  Substance Use Topics  . Smoking status: Never Smoker   . Smokeless tobacco: Never Used  . Alcohol Use: No    FAMILY HISTORY: Family History  Problem Relation Age of Onset  . Cancer Maternal Grandmother     breast  . Cancer Maternal Grandfather     prostate  . Diabetes Neg Hx   . Heart disease Neg Hx   . Stroke Neg Hx     EXAM: BP 121/80 mmHg  Pulse 89  Temp(Src) 97.7 F (36.5 C) (Oral)  Resp 18  SpO2 98% CONSTITUTIONAL: Alert and oriented and responds  appropriately to questions. Well-appearing; well-nourished HEAD: Normocephalic EYES: Conjunctivae clear, PERRL ENT: normal nose; no rhinorrhea; moist mucous membranes NECK: Supple, no meningismus, no LAD  CARD: RRR; S1 and S2 appreciated; no murmurs, no clicks, no rubs, no gallops RESP: Normal chest excursion without splinting or tachypnea; breath sounds clear and equal bilaterally; no wheezes, no rhonchi, no rales, no hypoxia or respiratory distress, speaking full sentences ABD/GI: Normal bowel sounds; non-distended; soft, non-tender, no rebound, no guarding, no peritoneal signs BACK:  The back appears normal and is non-tender to palpation, there is no CVA tenderness EXT: Normal ROM in all joints; non-tender to palpation; no edema; normal capillary refill; no cyanosis, no calf tenderness or swelling    SKIN: Normal color for age and race; warm; no rash NEURO: Moves all extremities equally, sensation to light touch intact diffusely, cranial nerves II through XII intact PSYCH: Patient has flat affect. Endorses suicidal thoughts with plan. No HI or hallucinations. He does appear to have insight.  MEDICAL DECISION MAKING: Patient here with suicidal thoughts with plan. No current medical complaints. Screening labs, urine unremarkable. We'll consult TTS.  ED PROGRESS:    6:25 AM  D/w Corrie DandyMary with TTS.  Patient meets inpt criteria.  They will look for placement.  Patient is here voluntarily at this time.  Layla MawKristen N Nidya Bouyer, DO 02/20/16 [phone]

## 2016-02-20 NOTE — Plan of Care (Signed)
Problem: Diagnosis: Increased Risk For Suicide Attempt Goal: STG-Patient Will Report Suicidal Feelings to Staff Outcome: Progressing Patient allowed to vent, no bedtime medications, no PRN given, 15 minute checks and random visual contact maintained for safety, clinical and moral support provided, patient encouraged to continue to express feelings and demonstrate safe care. Patient remain free from harm, will continue to monitor.

## 2016-02-20 NOTE — Progress Notes (Signed)
Observed sitting up in bed reading The Bible; receptive, pleasant, great insight, strong support, gainfully employed, "this is my first night here and I am struggling; I am here because my depression is getting worse, I did not take my medications - Lexapro - for 2 days, then, I took it before driving to work, got pulled over because I was making 45 m/hr on the high way. 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 Denied pain, denied SI now, will talk to the nursing staffs for intrusive or negative thoughts.

## 2016-02-20 NOTE — Tx Team (Signed)
Initial Interdisciplinary Treatment Plan   PATIENT STRESSORS: Financial difficulties Marital or family conflict   PATIENT STRENGTHS: Ability for insight Average or above average intelligence Capable of independent living Communication skills General fund of knowledge Motivation for treatment/growth Religious Affiliation Supportive family/friends Work skills   PROBLEM LIST: Problem List/Patient Goals Date to be addressed Date deferred Reason deferred Estimated date of resolution  Depression 02/20/2016     Suicidal ideation 02/20/2016                                                DISCHARGE CRITERIA:  Improved stabilization in mood, thinking, and/or behavior  PRELIMINARY DISCHARGE PLAN: Outpatient therapy  PATIENT/FAMIILY INVOLVEMENT: This treatment plan has been presented to and reviewed with the patient, William Cruz, and/or family member, .  The patient and family have been given the opportunity to ask questions and make suggestions.  William Cruz, William Cruz 02/20/2016, 4:26 PM

## 2016-02-20 NOTE — Progress Notes (Signed)
Pt presents with sad, depressed affect. Denies HI, has passive thoughts of suicide but denies at present. Denies HI, AVH and endorses depression. Pt voluntarily committed due to increased depression. Was pulled over by the police cue to going 45 in a 70 and swirving. Py confused and unaware of his driving hazards. pts plan was to OD off Robaxin. Pt reports becomingly increasingly suicidal after  Argument with his wife. Skin and contraband search completed and witnessed by [facility], Charity fundraiserN. No issues noted. Oriented pt to room and unit. All questions answered. Pt verbally contracts for safety. Fluids offered. Will continue to assess and monitor for safety.
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
## 2016-02-20 NOTE — ED Notes (Signed)
Attempted report 

## 2016-02-20 NOTE — ED Notes (Signed)
Pt wife at bedside.

## 2016-02-21 ENCOUNTER — Ambulatory Visit: Payer: BLUE CROSS/BLUE SHIELD | Admitting: Internal Medicine

## 2016-02-21 ENCOUNTER — Encounter: Payer: Self-pay | Admitting: Psychiatry

## 2016-02-21 DIAGNOSIS — R55 Syncope and collapse: Secondary | ICD-10-CM

## 2016-02-21 DIAGNOSIS — F650 Fetishism: Secondary | ICD-10-CM

## 2016-02-21 DIAGNOSIS — F321 Major depressive disorder, single episode, moderate: Secondary | ICD-10-CM

## 2016-02-21 DIAGNOSIS — F639 Impulse disorder, unspecified: Secondary | ICD-10-CM

## 2016-02-21 LAB — VITAMIN B12: Vitamin B-12: 255 pg/mL (ref 180–914)

## 2016-02-21 LAB — RAPID HIV SCREEN (HIV 1/2 AB+AG)
HIV 1/2 ANTIBODIES: NONREACTIVE
HIV-1 P24 Antigen - HIV24: NONREACTIVE

## 2016-02-21 LAB — TSH: TSH: 1.089 u[IU]/mL (ref 0.350–4.500)

## 2016-02-21 LAB — HEMOGLOBIN A1C: HEMOGLOBIN A1C: 5 % (ref 4.0–6.0)

## 2016-02-21 MED ORDER — FLUOXETINE HCL 10 MG PO CAPS
10.0000 mg | ORAL_CAPSULE | Freq: Every day | ORAL | Status: DC
  Administered 2016-02-21 – 2016-02-25 (×5): 10 mg via ORAL
  Filled 2016-02-21 (×5): qty 1

## 2016-02-21 MED ORDER — TRAZODONE HCL 50 MG PO TABS
50.0000 mg | ORAL_TABLET | Freq: Every evening | ORAL | Status: DC | PRN
  Filled 2016-02-21: qty 1

## 2016-02-21 NOTE — Progress Notes (Signed)
Pt pleasant and cooperative. Visible in mileu. Interacting appropriately with staff and peers. No negative behaviors. Denies SI, endorses depression. Med and group compliant. Rates depression at 4 and anxiety at 4. Encouragement and support offered. Pt receptive. Reports sleeping well last pm. Goal is to meet with doctor and work on recovery plan.  Pt remains safe on unit with q 15 min safety checks.
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
## 2016-02-21 NOTE — [facility] Suicide Risk Assessment (Signed)
[facility] INPATIENT:  Family/Significant Other Suicide Prevention Education  Suicide Prevention Education:  Patient Refusal for Family/Significant Other Suicide Prevention Education: The patient William Cruz has refused to provide written consent for family/significant other to be provided Family/Significant Other Suicide Prevention Education during admission and/or prior to discharge.  Physician notified.  CSW completed SPe with the pt.  Dorothe PeaJonathan F Hevin Jeffcoat 02/21/2016, 4:07 PM
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
## 2016-02-21 NOTE — [facility] Group Notes (Signed)
[facility] LCSW Group Therapy   02/21/2016 1pm   Type of Therapy: Group Therapy   Participation Level: Active   Participation Quality: Attentive, Sharing and Supportive   Affect: Appropriate   Cognitive: Alert and Oriented   Insight: Developing/Improving and Engaged   Engagement in Therapy: Developing/Improving and Engaged   Modes of Intervention: Clarification, Confrontation, Discussion, Education, Exploration, Limit-setting, Orientation, Problem-solving, Rapport Building, Dance movement psychotherapisteality Testing, Socialization and Support   Summary of Progress/Problems: The topic for group was balance in life. Today's group focused on defining balance in one's own words, identifying things that can knock one off balance, and exploring healthy ways to maintain balance in life. Group members were asked to provide an example of a time when they felt off balance, describe how they handled that situation, and process healthier ways to regain balance in the future. Group members were asked to share the most important tool for maintaining balance that they learned [facility] and how they plan to apply this method after discharge.  Pt shared that the issues in life that he is dealing with are financial difficulties and problems resulting from relationship issues with his wife.  Pt shared that he believes that resolving these two issues are key to his mental health recovery upon discharge.  Pt was polite and cooperative with the CSW and other group members and focused and attentive to the topics discussed and the sharing of others.  Pt did not share at length.

## 2016-02-21 NOTE — [facility] Group Notes (Signed)
[facility] Group Notes:  (Nursing/MHT/Case Management/Adjunct)  Date:  02/21/2016  Time:  3:52 PM  Type of Therapy:  Psychoeducational Skills  Participation Level:  Minimal  Participation Quality:  Appropriate  Affect:  Appropriate  Cognitive:  Appropriate  Insight:  Appropriate  Engagement in Group:  Supportive  Modes of Intervention:  Activity  Summary of Progress/Problems:  William Cruz 02/21/2016, 3:52 PM

## 2016-02-21 NOTE — [facility] Suicide Risk Assessment (Addendum)
[facility] Ad[facility]   Nursing information obtained from:  Patient Demographic factors:  Male, Adolescent or young adult Current Mental Status:  Self-harm thoughts Loss Factors:  Financial problems / change in socioeconomic status Historical Factors:  NA Risk Reduction Factors:  Sense of responsibility to family, Religious beliefs about death, Living with another person, especially a relative, Positive social support, Positive coping skills or problem solving skills, Positive therapeutic relationship  Total Time spent with patient: 1 hour Principal Problem: MDD (major depressive disorder) (HCC) Diagnosis:   Patient Active Problem List   Diagnosis Date Noted  . Syncope [R55] 02/21/2016  . 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 MDD (major depressive disorder) (HCC) [F32.9] 02/20/2016  . Obesity (BMI 30-39.9) [E66.9] 03/06/2014   Subjective Data:   Continued Clinical Symptoms:  Alcohol Use Disorder Identification Test Final Score (AUDIT): 1 The "Alcohol Use Disorders Identification Test", Guidelines for Use in Primary Care, Second Edition.  World Science writerHealth Organization [facility](WHO). Score between 0-7:  no or low risk or alcohol related problems. Score between 8-15:  moderate risk of alcohol related problems. Score between 16-19:  high risk of alcohol related problems. Score 20 or above:  warrants further diagnostic evaluation for alcohol dependence and treatment.   CLINICAL FACTORS:   Depression:   Severe Previous Psychiatric Diagnoses and Treatments   Psychiatric Specialty Exam: ROS                                                          COGNITIVE FEATURES THAT CONTRIBUTE TO RISK:  None    SUICIDE RISK:   Mild:  Suicidal ideation of limited frequency, intensity, duration, and specificity.  There are no identifiable plans, no associated intent, mild dysphoria and related symptoms, good self-control  (both objective and subjective assessment), few other risk factors, and identifiable protective factors, including available and accessible social support.  [facility] OF CARE[facility]  I certify that inpatient services furnished can reasonably be expected to improve the patient's condition.   Jimmy FootmanHernandez-Gonzalez,  Shanel Prazak, MD 02/21/2016, 12:30 PM

## 2016-02-21 NOTE — [facility] Group Notes (Signed)
[facility] Group Notes:  (Nursing/MHT/Case Management/Adjunct)  Date:  02/21/2016  Time:  12:58 AM  Type of Therapy:  Group Therapy  Participation Level:  Active  Participation Quality:  Appropriate  Affect:  Appropriate  Cognitive:  Appropriate  Insight:  Appropriate  Engagement in Group:  Engaged  Modes of Intervention:  n/a  Summary of Progress/Problems:  Veva Holesshley Imani Brandelyn Henne 02/21/2016, 12:58 AM

## 2016-02-21 NOTE — Progress Notes (Signed)
Recreation Therapy Notes  INPATIENT RECREATION THERAPY ASSESSMENT  Patient Details Name: William Cruz MRN: [id number] DOB: [birth year] Today's Date: 02/21/2016  Patient Stressors: Relationship, Work, Other (Comment) (Stressful with wife-knows he causes her strses; been stressed over a project that is late for work; Actuaryfinances)  Coping Skills:   Isolate, Avoidance, Exercise, Art/Dance, Talking, Music, Sports, Other (Comment) (Play games on computer, pornography (has addiction))  Personal Challenges: Communication, Concentration, Relationships, Self-Esteem/Confidence, Stress Management, Work Nutritional therapisterformance  Leisure Interests (2+):  Individual - TV, Individual - Other (Comment) (Play games on computer)  Awareness of Community Resources:  No  Community Resources:     Current Use:    If no, Barriers?:    Patient Strengths:  Caring, well educated  Patient Identified Areas of Improvement:  Physical health and addiction  Current Recreation Participation:  On computer hanging out with friends  Patient Goal for Hospitalization:  To come up with plans and strategies to help with depressiona nd issues so he doesn't come back  [place]  [place] Residence:  [place]   Current [place] (including self-harm):  No  Current HI:  No  Consent to Intern Participation: N/A   Jacquelynn CreeGreene,Brysin Towery M, LRT/CTRS 02/21/2016, 3:32 PM

## 2016-02-21 NOTE — Progress Notes (Signed)
Recreation Therapy Notes  Date: 03.09.17 Time: 1:00 pm Location: Craft Room  Group Topic: Leisure Education/Coping Skills  Goal Area(s) Addresses:  Patient will identify things they are grateful for.  Patient will identify how being grateful can influence decision making.  Behavioral Response: Attentive  Intervention: LexicographerGrateful Wheel  Activity: Patients were given an I Am Grateful For worksheet and encouraged to list 2-3 things they were grateful for under each category.  Education: LRT educated patient on leisure and why it is a good Associate Professorcoping skill.  Education Outcome: In group clarification offered  Clinical Observations/Feedback: Patient completed activity by writing at least one thing he was grateful for under each category. Patient did not contribute to group discussion.  Jacquelynn CreeGreene,Shefali Ng M, LRT/CTRS 02/21/2016 3:11 PM

## 2016-02-21 NOTE — Plan of Care (Signed)
Problem: Ineffective individual coping Goal: STG: Patient will remain free from self harm Outcome: Progressing No self harm reported or observed     
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
## 2016-02-22 DIAGNOSIS — R55 Syncope and collapse: Secondary | ICD-10-CM

## 2016-02-22 MED ORDER — CYANOCOBALAMIN 1000 MCG/ML IJ SOLN
1000.0000 ug | Freq: Every day | INTRAMUSCULAR | Status: DC
Start: 2016-02-22 — End: 2016-02-25
  Administered 2016-02-22 – 2016-02-25 (×4): 1000 ug via SUBCUTANEOUS
  Filled 2016-02-22 (×4): qty 1

## 2016-02-22 NOTE — [facility] Group Notes (Signed)
[facility] LCSW Aftercare Discharge Planning Group Note   02/22/2016 12:49 PM  Participation Quality: Did not attend.   Claus Silvestro L Artemisa Sladek MSW, LCSWA  

## 2016-02-22 NOTE — Progress Notes (Signed)
D: Pt denies SI/HI/AVH. Pt is pleasant and cooperative, affect is flat and sad but brightens upon approach. Pt stated he feels better from going to groups and taking his medication. Patient appears less anxious and he is interacting with peers and staff appropriately.  A: Pt was offered support and encouragement. Pt was given scheduled medications. Pt was encouraged to attend groups. Q 15 minute checks were done for safety.  R:Pt attends groups and interacts well with peers and staff. Pt is taking medication. Pt has no complaints.Pt receptive to treatment and safety maintained on unit.

## 2016-02-22 NOTE — Progress Notes (Signed)
Patient was pleasant & cooperative on approach.Denies suicidal ideations.Attended groups.Appropriate with staff & peers.Compliant with medications.Visible in the milieu.Appetite good.

## 2016-02-22 NOTE — Progress Notes (Signed)
EEG completed; results pending.    

## 2016-02-22 NOTE — Plan of Care (Signed)
Problem: Diagnosis: Increased Risk For Suicide Attempt Goal: LTG-Patient Will Report Improved Mood and Deny Suicidal LTG (by discharge) Patient will report improved mood and deny suicidal ideation.  Outcome: Progressing Denies suicidal ideations.     

## 2016-02-22 NOTE — [facility] Group Notes (Signed)
[facility] LCSW Group Therapy   02/22/2016 1:15 PM   Type of Therapy: Group Therapy   Participation Level: Active   Participation Quality: Attentive, Sharing and Supportive   Affect: Depressed and Flat   Cognitive: Alert and Oriented   Insight: Developing/Improving and Engaged   Engagement in Therapy: Developing/Improving and Engaged   Modes of Intervention: Clarification, Confrontation, Discussion, Education, Exploration, Limit-setting, Orientation, Problem-solving, Rapport Building, Dance movement psychotherapisteality Testing, Socialization and Support   Summary of Progress/Problems: The topic for today was feelings about relapse. Pt discussed what relapse prevention is to them and identified triggers that they are on the path to relapse. Pt processed their feeling towards relapse and was able to relate to peers. Pt discussed coping skills that can be used for relapse prevention.  Pt shared that he identified relapse as self-destructive actions and self-sabotage.  Pt shared that for him a trigger leading to relapse was conflict within his family.  Pt shared his coping mechanism was trying to achieve balance in her life with proper medication management and good support.  Pt was polite and cooperative with the CSW and other group members and focused and attentive to the topics discussed and the sharing of others.   Dorothe PeaJonathan F. Gila Lauf, MSW, LCSWA, LCAS

## 2016-02-22 NOTE — Progress Notes (Signed)
Recreation Therapy Notes  Date: 03.10.17 Time: 3:00 pm Location: Craft Room  Group Topic: Communication, Problem solving, Teamwork  Goal Area(s) Addresses:  Patient will work in teams towards shared goal. Patient will verbalize skills needed to make activity successful. Patient will verbalize benefit of using skills identified to reach post d/c goals.  Behavioral Response: Attentive, Interactive  Intervention: Landing Pad  Activity: Patients were put in groups and instructed to build a contraption to catch the golf ball with 15 straws and approximately 2.5 feet of tape.  Education: LRT educated patients on healthy support systems.  Education Outcome: Acknowledges education/In group clarification offered  Clinical Observations/Feedback: Patient worked with peers to build contraption. Patient contributed to group discussion by stating that his team was successful and why it thought so, what skills his team used to make them successful, why communication, problem solving, and teamwork are important, and how he can use them effectively once he is d/c.  Jacquelynn CreeGreene,Murice Barbar M, LRT/CTRS 02/22/2016 4:16 PM

## 2016-02-22 NOTE — Plan of Care (Signed)
Problem: [facility] Participation in Recreation Therapeutic Interventions Goal: STG-Patient will demonstrate improved self esteem by identif STG: Self-Esteem - Within 4 treatment sessions, patient will verbalize at least 5 positive affirmation statements in each of 2 treatment sessions to increase self-esteem post d/c.  Outcome: Progressing Treatment Session 1; Completed 1 out of 2: At approximately 11:45 am, LRT met with patient in consultation room. Patient verbalized 5 positive affirmation statements. Patient reported it felt "good". LRT encouraged patient to continue saying positive affirmation statements Intervention Used: I Am statements  Leonette Monarch, LRT/CTRS 03.10.17 12:13 pm Goal: STG-Other Recreation Therapy Goal (Specify) STG: Stress Management - Within 4 treatment sessions, patient will verbalize understanding of the stress management techniques in each of 2 treatment sessions to increase stress management skills post d/c.  Outcome: Progressing Treatment Session 1; Completed 1 out of 2: At approximately 11:45 am, LRT met with patient in consultation room. LRT educated and provided patient with handouts on stress management techniques. Patient verbalized understanding. LRT encouraged patient to read over and practice the stress management techniques. Intervention Used: Stress Management handouts  Leonette Monarch, LRT/CTRS 03.10.17 12:15 pm

## 2016-02-22 NOTE — [facility] Group Notes (Signed)
[facility] Group Notes:  (Nursing/MHT/Case Management/Adjunct)  Date:  02/22/2016  Time:  2:01 AM  Type of Therapy:  Group Therapy  Participation Level:  Active  Participation Quality:  Appropriate  Affect:  Appropriate  Cognitive:  Appropriate  Insight:  Appropriate  Engagement in Group:  Engaged  Modes of Intervention:  n/a  Summary of Progress/Problems:  Veva Holesshley Imani Audreyanna Butkiewicz 02/22/2016, 2:01 AM

## 2016-02-22 NOTE — Procedures (Addendum)
ELECTROENCEPHALOGRAM REPORT   Patient: William Cruz       Room #: 320A-AA EEG No. ID: [id number] Age: 30 y.o.        Sex: male Referring Physician: Hernandez-Gonzalez Report Date:  02/22/2016        Interpreting Physician: Thana FarrEYNOLDS, Heidee Audi  History: William Cruz is an [age] y.o. male with depression and history of syncope  Medications:  Scheduled: . FLUoxetine  10 mg Oral Daily    Conditions of Recording:  This is a 16 channel EEG carried out with the patient in the awake, drowsy and asleep states.  Description:  The waking background activity consists of a low voltage, symmetrical, fairly well organized, 11 Hz alpha activity, seen from the parieto-occipital and posterior temporal regions.  Low voltage fast activity, poorly organized, is seen anteriorly and is at times superimposed on more posterior regions.  A mixture of theta and alpha rhythms are seen from the central and temporal regions. The patient drowses with slowing to irregular, low voltage theta and beta activity.   The patient goes in to a light sleep with symmetrical sleep spindles, vertex central sharp transients and irregular slow activity.   Hyperventilation was performed and produced a mild to moderate buildup but failed to elicit any abnormalities. Intermittent photic stimulation was performed but failed to illicit any change in the tracing.     IMPRESSION: Normal electroencephalogram, awake, asleep and with activation procedures. There are no focal lateralizing or epileptiform features.   Thana FarrLeslie Carson Meche, MD Neurology [phone] 02/22/2016, 10:26 AM
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
## 2016-02-22 NOTE — Plan of Care (Signed)
Problem: Alteration in mood Goal: LTG-Patient reports reduction in suicidal thoughts (Patient reports reduction in suicidal thoughts and is able to verbalize a safety plan for whenever patient is feeling suicidal)  Outcome: Progressing Patient denies SI/HI.      

## 2016-02-22 NOTE — [facility] Group Notes (Signed)
[facility] Group Notes:  (Nursing/MHT/Case Management/Adjunct)  Date:  02/22/2016  Time:  12:30 PM  Type of Therapy:  Psychoeducational Skills  Participation Level:  Did Not Attend    William Farberamela M Moses Cruz 02/22/2016, 12:30 PM

## 2016-02-23 DIAGNOSIS — F322 Major depressive disorder, single episode, severe without psychotic features: Secondary | ICD-10-CM

## 2016-02-23 NOTE — Plan of Care (Signed)
Problem: Ineffective individual coping Goal: LTG: Patient will report a decrease in negative feelings Outcome: Progressing Pt reports feeling much better     

## 2016-02-23 NOTE — Progress Notes (Signed)
[facility]  02/23/2016 5:13 PM William Freestoneustin Mooers  MRN:  [id number] Subjective:  Follow-up for this gentleman with major depression who had recently been bored confused and possibly had syncope. He tells me today that he is feeling much better. Mood is improved. Denies any sense of depression. Denies any suicidal ideation. Feels optimistic about getting out of the hospital. No physical complaints has not felt dizzy all day. Principal Problem: MDD (major depressive disorder) (HCC) Diagnosis:   Patient Active Problem List   Diagnosis Date Noted  . Syncope [R55] 02/21/2016  . 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 MDD (major depressive disorder) (HCC) [F32.9] 02/20/2016  . Obesity (BMI 30-39.9) [E66.9] 03/06/2014   Total Time spent with patient: 30 minutes  Past Psychiatric History: Past history of treatment as an outpatient for depression. Also recurrent episodes of syncope no history of suicide attempts.  Past Medical History:  Past Medical History  Diagnosis Date  . Depression     Past Surgical History  Procedure Laterality Date  . Cholecystectomy     Family History:  Family History  Problem Relation Age of Onset  . Cancer Maternal Grandmother     breast  . Cancer Maternal Grandfather     prostate  . Diabetes Neg Hx   . Heart disease Neg Hx   . Stroke Neg Hx    Family Psychiatric  History: No identified family history of mental illness Social History:  History  Alcohol Use No     History  Drug Use No    Social History   Social History  . Marital Status: Married    Spouse Name: N/A  . Number of Children: N/A  . Years of Education: N/A   Social History Main Topics  . Smoking status: Never Smoker   . Smokeless tobacco: Never Used  . Alcohol Use: No  . Drug Use: No  . Sexual Activity: Yes   Other Topics Concern  . None   Social History Narrative   Additional Social History:                          Sleep: Good  Appetite:  Good  Current Medications: Current Facility-Administered Medications  Medication Dose Route Frequency Provider Last Rate Last Dose  . acetaminophen (TYLENOL) tablet 650 mg  650 mg Oral Q6H PRN Jimmy FootmanAndrea Hernandez-Gonzalez, MD      . alum & mag hydroxide-simeth (MAALOX/MYLANTA) 200-200-20 MG/5ML suspension 30 mL  30 mL Oral Q4H PRN Jimmy FootmanAndrea Hernandez-Gonzalez, MD      . cyanocobalamin ((VITAMIN B-12)) injection 1,000 mcg  1,000 mcg Subcutaneous Daily Jimmy FootmanAndrea Hernandez-Gonzalez, MD   1,000 mcg at 02/23/16 0937  . FLUoxetine (PROZAC) capsule 10 mg  10 mg Oral Daily Jimmy FootmanAndrea Hernandez-Gonzalez, MD   10 mg at 02/23/16 0937  . ibuprofen (ADVIL,MOTRIN) tablet 600 mg  600 mg Oral Q6H PRN Jimmy FootmanAndrea Hernandez-Gonzalez, MD      . magnesium hydroxide (MILK OF MAGNESIA) suspension 30 mL  30 mL Oral Daily PRN Jimmy FootmanAndrea Hernandez-Gonzalez, MD      . traZODone (DESYREL) tablet 50 mg  50 mg Oral QHS PRN Jimmy FootmanAndrea Hernandez-Gonzalez, MD        Lab Results: No results found for this or any previous visit (from the past 48 hour(s)).  Blood Alcohol level:  Lab Results  Component Value Date   ETH <5 02/19/2016    Physical Findings: AIMS: Facial and Oral Movements Muscles of Facial  Expression: None, normal Lips and Perioral Area: None, normal Jaw: None, normal Tongue: None, normal,Extremity Movements Upper (arms, wrists, hands, fingers): None, normal Lower (legs, knees, ankles, toes): None, normal, Trunk Movements Neck, shoulders, hips: None, normal, Overall Severity Severity of abnormal movements (highest score from questions above): None, normal Incapacitation due to abnormal movements: None, normal Patient's awareness of abnormal movements (rate only patient's report): No Awareness, Dental Status Current problems with teeth and/or dentures?: No Does patient usually wear dentures?: No  CIWA:  CIWA-Ar Total: 0 COWS:     Musculoskeletal: Strength & Muscle Tone: within normal  limits Gait & Station: normal Patient leans: N/A  Psychiatric Specialty Exam: Review of Systems  Constitutional: Negative.   HENT: Negative.   Eyes: Negative.   Respiratory: Negative.   Cardiovascular: Negative.   Gastrointestinal: Negative.   Musculoskeletal: Negative.   Skin: Negative.   Neurological: Negative.   Psychiatric/Behavioral: Negative for depression, suicidal ideas, hallucinations, memory loss and substance abuse. The patient is not nervous/anxious and does not have insomnia.     Blood pressure 116/66, pulse 60, temperature 98.4 F (36.9 C), temperature source Oral, resp. rate 20, height  (1.854 m), weight 127.461 kg (281 lb), SpO2 99 %.Body mass index is 37.08 kg/(m^2).  General Appearance: Fairly Groomed  Patent attorney::  Good  Speech:  Clear and Coherent  Volume:  Normal  Mood:  Euthymic  Affect:  Appropriate  Thought Process:  Goal Directed  Orientation:  Full (Time, Place, and Person)  Thought Content:  Negative  Suicidal Thoughts:  No  Homicidal Thoughts:  No  Memory:  Immediate;   Good Recent;   Good Remote;   Good  Judgement:  Fair  Insight:  Fair  Psychomotor Activity:  Normal  Concentration:  Good  Recall:  Good  Fund of Knowledge:Good  Language: Good  Akathisia:  No  Handed:  Right  AIMS (if indicated):     Assets:  Financial Resources/Insurance Physical Health Social Support Vocational/Educational  ADL's:  Intact  Cognition: WNL  Sleep:  Number of Hours: 8   Treatment Plan Summary: Daily contact with patient to assess and evaluate symptoms and progress in treatment, Medication management and Plan Tolerating Prozac well. Tests all look good. No change to medication for today. Supportive encouragement and review of treatment plan. Possible discharge Monday.  William Rasmussen, MD 02/23/2016, 5:13 PM

## 2016-02-23 NOTE — Progress Notes (Signed)
Pt presents with bright affect. Denies SI, HI, AVH. Med and group compliant. Appropriate with staff and peers. Pt reports feeling much better. Visible in the mileu.  Encouragement and support offered. Medications given as prescribe. Pt receptive and remains safe on unit with q 15 min checks.

## 2016-02-23 NOTE — Progress Notes (Signed)
D: Pt pleasant and cooperative. Denies SI/AVH. Denies pain. Voices no additional concerns. Appropriate interactions with peers and staff.  A: Encouragement and support provided. Q15 minute checks maintained for safety.  R: Pt remains safe on unit. Pleasant and cooperative with staff and peers. Will continue to monitor.

## 2016-02-23 NOTE — [facility] Group Notes (Signed)
[facility] LCSW Group Therapy  02/23/2016 4:31 PM  Type of Therapy:  Group Therapy  Participation Level:  Active  Participation Quality:  Attentive  Affect:  Appropriate  Cognitive:  Alert  Insight:  Improving  Engagement in Therapy:  Improving  Modes of Intervention:  Discussion, Education, Socialization and Support  Summary of Progress/Problems: Balance in life: Patients will discuss the concept of balance and how it looks and feels to be unbalanced. Pt will identify areas in their life that is unbalanced and ways to become more balanced. Pt attended group and stayed the entire time. He discussed financial stressors as well as relationship issues.      Daisy Floroandace L Yasmin Bronaugh MSW, LCSWA  02/23/2016, 4:31 PM

## 2016-02-24 DIAGNOSIS — F322 Major depressive disorder, single episode, severe without psychotic features: Secondary | ICD-10-CM | POA: Insufficient documentation

## 2016-02-24 NOTE — Progress Notes (Signed)
D:  Pleasant and cooperative.  Denies any SI/HI/AVH.  Verbalizes that he has  some anxiety and feels a little tired today although he has started sleeping better.  Reminded patient that time changed last night and essentially lost an hour of sleep.  Visible in the milieu.  Interacting with peers and staff appropriately.   A: Support and encouragement offered.  Medications administered according to orders.  Safety maintained.   R:  Medication and group compliant.  Receptive to treatment regiment.

## 2016-02-24 NOTE — Plan of Care (Signed)
Problem: Diagnosis: Increased Risk For Suicide Attempt Goal: LTG-Patient will be able to identify a plan to address LTG - Patient will be able to identify a plan to address suicidal feelings after discharge  Outcome: Progressing Verbalizes that he is feeling better.  Denies SI.  Further states that his goal is to stop isolating himself and keeping things in.  Will start to utilize his family when start to feeling depressed and overwhelmed.

## 2016-02-24 NOTE — [facility] Group Notes (Signed)
[facility] Group Notes:  (Nursing/MHT/Case Management/Adjunct)  Date:  02/24/2016  Time:  9:28 PM  Type of Therapy:  Group Therapy  Participation Level:  Active  Participation Quality:  Appropriate  Affect:  Appropriate  Cognitive:  Appropriate  Insight:  Appropriate  Engagement in Group:  Engaged  Modes of Intervention:  Discussion  Summary of Progress/Problems:  Burt EkJanice Marie Harriet Sutphen 02/24/2016, 9:28 PM

## 2016-02-24 NOTE — Progress Notes (Signed)
[facility] MD Progress Note  02/24/2016 3:22 PM William Cruz  MRN:  [id number] Subjective:  Follow-up for 30 year old man with depression. Patient continues to state to me that he feels very good today. Denies any depression. He says he is very much looking forward to getting out of the hospital. He is taking good care of his hygiene. His affect is pleasant and his thoughts appear to be clear. He does not interact much with other patients on the unit. Not acting out in any dangerous manner. Able to articulate an appropriate plan for discharge with decent insight. Principal Problem: MDD (major depressive disorder) (HCC) Diagnosis:   Patient Active Problem List   Diagnosis Date Noted  . Syncope [R55] 02/21/2016  . 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 MDD (major depressive disorder) (HCC) [F32.9] 02/20/2016  . Obesity (BMI 30-39.9) [E66.9] 03/06/2014   Total Time spent with patient: 30 minutes  Past Psychiatric History: Past history of treatment as an outpatient for depression. Also recurrent episodes of syncope no history of suicide attempts.  Past Medical History:  Past Medical History  Diagnosis Date  . Depression     Past Surgical History  Procedure Laterality Date  . Cholecystectomy     Family History:  Family History  Problem Relation Age of Onset  . Cancer Maternal Grandmother     breast  . Cancer Maternal Grandfather     prostate  . Diabetes Neg Hx   . Heart disease Neg Hx   . Stroke Neg Hx    Family Psychiatric  History: No identified family history of mental illness Social History:  History  Alcohol Use No     History  Drug Use No    Social History   Social History  . Marital Status: Married    Spouse Name: N/A  . Number of Children: N/A  . Years of Education: N/A   Social History Main Topics  . Smoking status: Never Smoker   . Smokeless tobacco: Never Used  . Alcohol Use: No  . Drug Use: No  . Sexual Activity: Yes    Other Topics Concern  . None   Social History Narrative   Additional Social History:                         Sleep: Good  Appetite:  Good  Current Medications: Current Facility-Administered Medications  Medication Dose Route Frequency Provider Last Rate Last Dose  . acetaminophen (TYLENOL) tablet 650 mg  650 mg Oral Q6H PRN Jimmy Footman, MD      . alum & mag hydroxide-simeth (MAALOX/MYLANTA) 200-200-20 MG/5ML suspension 30 mL  30 mL Oral Q4H PRN Jimmy Footman, MD      . cyanocobalamin ((VITAMIN B-12)) injection 1,000 mcg  1,000 mcg Subcutaneous Daily Jimmy Footman, MD   1,000 mcg at 02/24/16 1036  . FLUoxetine (PROZAC) capsule 10 mg  10 mg Oral Daily Jimmy Footman, MD   10 mg at 02/24/16 1035  . ibuprofen (ADVIL,MOTRIN) tablet 600 mg  600 mg Oral Q6H PRN Jimmy Footman, MD      . magnesium hydroxide (MILK OF MAGNESIA) suspension 30 mL  30 mL Oral Daily PRN Jimmy Footman, MD      . traZODone (DESYREL) tablet 50 mg  50 mg Oral QHS PRN Jimmy Footman, MD        Lab Results: No results found for this or any previous visit (from the past 48 hour(s)).  Blood  Alcohol level:  Lab Results  Component Value Date   ETH <5 02/19/2016    Physical Findings: AIMS: Facial and Oral Movements Muscles of Facial Expression: None, normal Lips and Perioral Area: None, normal Jaw: None, normal Tongue: None, normal,Extremity Movements Upper (arms, wrists, hands, fingers): None, normal Lower (legs, knees, ankles, toes): None, normal, Trunk Movements Neck, shoulders, hips: None, normal, Overall Severity Severity of abnormal movements (highest score from questions above): None, normal Incapacitation due to abnormal movements: None, normal Patient's awareness of abnormal movements (rate only patient's report): No Awareness, Dental Status Current problems with teeth and/or dentures?: No Does patient  usually wear dentures?: No  CIWA:  CIWA-Ar Total: 0 COWS:     Musculoskeletal: Strength & Muscle Tone: within normal limits Gait & Station: normal Patient leans: N/A  Psychiatric Specialty Exam: Review of Systems  Constitutional: Negative.   HENT: Negative.   Eyes: Negative.   Respiratory: Negative.   Cardiovascular: Negative.   Gastrointestinal: Negative.   Musculoskeletal: Negative.   Skin: Negative.   Neurological: Negative.   Psychiatric/Behavioral: Negative for depression, suicidal ideas, hallucinations, memory loss and substance abuse. The patient is not nervous/anxious and does not have insomnia.     Blood pressure 107/43, pulse 71, temperature 98.2 F (36.8 C), temperature source Oral, resp. rate 18, height 6\' 1"  (1.854 m), weight 127.461 kg (281 lb), SpO2 99 %.Body mass index is 37.08 kg/(m^2).  General Appearance: Fairly Groomed  Patent attorneyye Contact::  Good  Speech:  Clear and Coherent  Volume:  Normal  Mood:  Euthymic  Affect:  Appropriate  Thought Process:  Goal Directed  Orientation:  Full (Time, Place, and Person)  Thought Content:  Negative  Suicidal Thoughts:  No  Homicidal Thoughts:  No  Memory:  Immediate;   Good Recent;   Good Remote;   Good  Judgement:  Fair  Insight:  Fair  Psychomotor Activity:  Normal  Concentration:  Good  Recall:  Good  Fund of Knowledge:Good  Language: Good  Akathisia:  No  Handed:  Right  AIMS (if indicated):     Assets:  Financial Resources/Insurance Physical Health Social Support Vocational/Educational  ADL's:  Intact  Cognition: WNL  Sleep:  Number of Hours: 6.45   Treatment Plan Summary: Daily contact with patient to assess and evaluate symptoms and progress in treatment, Medication management and Plan Patient is doing well today and there is no indication to change medicine. Supportive counseling. Reviewed with him the importance of staying active in outpatient treatment. He is talking with his wife daily and feels  comfortable with the discharge plan. He is to follow-up with his primary doctor here about possible discharge tomorrow.  Mordecai RasmussenJohn Clapacs, MD 02/24/2016, 3:22 PM

## 2016-02-24 NOTE — Progress Notes (Signed)
D: Pt denies SI/AVH. Visible with appropriate interactions amongst peers and staff. Bright affect. Denies pain. Hopeful for future after discharge.  A: Encouragement and support provided. Medications given as prescribed. Q15 minute checks maintained for safety. R: Receptive to treatment and compliant with treatment plan. Remains safe on unit. Voices no additional concerns. Pleasant. Will continue to monitor.

## 2016-02-24 NOTE — [facility] Group Notes (Signed)
[facility] LCSW Group Therapy  02/24/2016 2:31 PM  Type of Therapy:  Group Therapy  Participation Level:  Active  Participation Quality:  Attentive  Affect:  Appropriate  Cognitive:  Alert  Insight:  Improving  Engagement in Therapy:  Improving  Modes of Intervention:  Discussion, Education, Socialization and Support  Summary of Progress/Problems:Self esteem: Patients discussed self esteem and how it impacts them. They discussed what aspects in their lives has influenced their self esteem. They were challenged to identify changes that are needed in order to improve self esteem.  William Cruz discussed difficulties with self esteem in the past. He reports many members of his family struggles with self esteem. He reports as an adult, the idea of success impacts his self esteem the most.    William Cruz MSW, LCSWA  02/24/2016, 2:31 PM

## 2016-02-25 MED ORDER — TRAZODONE HCL 50 MG PO TABS: 50.0000 mg | ORAL_TABLET | Freq: Every evening | ORAL | Status: AC | PRN

## 2016-02-25 MED ORDER — FLUOXETINE HCL 10 MG PO CAPS: 10.0000 mg | ORAL_CAPSULE | Freq: Every day | ORAL | Status: AC
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
## 2016-02-25 NOTE — [facility] Suicide Risk Assessment (Signed)
[facility]   Principal Problem: MDD (major depressive disorder) [facility](HCC) Discharge Diagnoses:  Patient Active Problem List   Diagnosis Date Noted  . Severe single current episode of major depressive disorder, without psychotic features (HCC) [F32.2]   . Syncope [R55] 02/21/2016  . 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 MDD (major depressive disorder) (HCC) [F32.9] 02/20/2016  . Obesity (BMI 30-39.9) [E66.9] 03/06/2014      Psychiatric Specialty Exam: ROS                                                         Mental Status Per Nursing Assessment::   On Admission:  Self-harm thoughts  Demographic Factors:  Male and Caucasian  Loss Factors: relational problems with wife  Historical Factors: NA  Risk Reduction Factors:   Sense of responsibility to family, Religious beliefs about death, Employed, Living with another person, especially a relative and Positive social support  Continued Clinical Symptoms:  Previous Psychiatric Diagnoses and Treatments  Cognitive Features That Contribute To Risk:  None    Suicide Risk:  Minimal: No identifiable suicidal ideation.  Patients presenting with no risk factors but with morbid ruminations; may be classified as minimal risk based on the severity of the depressive symptoms  Follow-up Information    Follow up with [facility]  .   Why:  Please arrive for your hospital follow up with Dr. Beckey DowningWillett on Tuesday March 14th at 8am for therapy and on Wednesday 15th, 2017 at 9:30am for your assessment for medication managment    Contact information:   [facility] [address] [facility] [place], [place] [address] Phone:(336) [phone] Fax:336) [phone]        [facility]   Why:  Please arrive for your hospital follow up with Dr. Mayra ReelKate Clark, NP on Wednesday March 15th at  2:45pm    Contact information:   [facility] [address] [place], [place] [address] Phone: [phone] Fax: [phone]      Jimmy FootmanHernandez-Gonzalez,  Jujuan Dugo, MD 02/25/2016, 10:37 AM

## 2016-02-25 NOTE — [facility] Group Notes (Signed)
[facility]   02/25/2016 9:15 AM  ?  Participation Quality: Alert, Appropriate and Oriented   Mood/Affect: Engaged  Depression Rating: 0  Anxiety Rating: 4.5  Thoughts of Suicide: Pt denies SI/HI   Will you contract for safety? Yes   Current AVH: Pt denies   Plan for Discharge/Comments: Pt attended discharge planning group and actively participated in group. CSW provided pt with today's workbook. Pt shared his goal upon discharge is to return home and to seek support in the form of group therapy upon discharge.  Pt shared his fervent desire to be reunited with his wife and with his family. Pt shared that his anxiety level at a 7 in baseline for his symptoms, that he has always been an anxious person. Pt was polite and cooperative with the CSW and other group members and focused and attentive to the topics discussed and the sharing of others.  Transportation Means: Pt reports access to transportation   Supports: Pt lists family and friends as primary supports    William PeaJonathan F. Adolphe Fortunato, MSW, LCSWA, LCAS
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
## 2016-02-25 NOTE — Plan of Care (Signed)
Problem: Ineffective individual coping Goal: LTG: Patient will report a decrease in negative feelings Outcome: Progressing Patient is pleasant and cooperative. Denying thoughts of self harm and motivated for aftercare services

## 2016-02-25 NOTE — Plan of Care (Signed)
Problem: Alteration in mood Goal: LTG-Pt's behavior demonstrates decreased signs of depression (Patient's behavior demonstrates decreased signs of depression to the point the patient is safe to return home and continue treatment in an outpatient setting)  Outcome: Progressing Reports that his depression is decreasing, expressing readiness for discharge

## 2016-02-25 NOTE — Progress Notes (Signed)
Patient denies SI/HI, denies A/V hallucinations. Patient verbalizes understanding of discharge instructions, follow up care and prescriptions. Patient given all belongings from  locker. Patient escorted out by staff, transported by family. 

## 2016-02-25 NOTE — Progress Notes (Signed)
2000: patient currently in the dayroom with staff and peers. Alert and oriented x 4, pleasant and cooperative. Denies DI/HI, denies hallucinations. Reports that he is improving in mood.  2200: Patient attended group and remained cooperative. Has no concern so far. 2300: Patient stayed in the dayroom then went to bed. Currently resting. No sign of distress noted. Safety and security maintained.  0600: Patient slept all night, had no concern.

## 2016-02-25 NOTE — Plan of Care (Signed)
Problem: [facility] Participation in Recreation Therapeutic Interventions Goal: STG-Patient will demonstrate improved self esteem by identif STG: Self-Esteem - Within 4 treatment sessions, patient will verbalize at least 5 positive affirmation statements in each of 2 treatment sessions to increase self-esteem post d/c.  Outcome: Completed/Met Date Met:  02/25/16 Treatment Session 2; Completed 2 out of 2: At approximately 12:35 pm, LRT met with patient in consultation room. Patient verbalized 5 positive affirmation statements. Patient reported it felt "good". LRT encouraged patient to continue saying positive affirmation statements.  Intervention Used: I Am statements  Leonette Monarch, LRT/CTRS 03.13.17 1:19 pm Goal: STG-Other Recreation Therapy Goal (Specify) STG: Stress Management - Within 4 treatment sessions, patient will verbalize understanding of the stress management techniques in each of 2 treatment sessions to increase stress management skills post d/c.  Outcome: Completed/Met Date Met:  02/25/16 Treatment Session 2; Completed 2 out of 2: At approximately 12:35 pm, LRT met with patient in consultation room. Patient reported he read over and practiced the stress management techniques. Patient verbalized understanding and reported the techniques were helpful. LRT encouraged patient to continue practicing the stress management techniques. Intervention Used: Stress Management handouts  Leonette Monarch, LRT/CTRS 03.13.17 1:21 pm

## 2016-02-25 NOTE — Progress Notes (Signed)
  [facility] :  Will you be returning to the same living situation after discharge:  Yes,  home with his wife At discharge, do you have transportation home?: Yes,  patient's wife will pick up Do you have the ability to pay for your medications: Yes,  patient has [employer] insurance  Release of information consent forms completed and in the chart;  Patient's signature needed at discharge.  Patient to Follow up at: Follow-up Information    Follow up with [facility]   On 02/26/2016.   Why:  Please arrive for your hospital follow up with Dr. Beckey DowningWillett on Tuesday March 14th at 8am for therapy and on Wednesday 15th, 2017 at 9:30am for your assessment for medication managment    Contact information:   [facility] [address] [facility] [place], [place] [address] [phone] Fax:[phone]        [facility].   Why:  Please arrive for your hospital follow up with Dr. Mayra ReelKate Clark, NP on Wednesday March 15th at 2:45pm    Contact information:   [facility] [address] [place], [place] [address] Phone: [phone] Fax: [phone]      Next level of care provider has access to [facility] Link:no  Safety Planning and Suicide Prevention discussed: Yes,  SPE discussed with patient and Renee HarderKaylin Gallentine (wife) [phone]  Have you used any form of tobacco in the last 30 days? (Cigarettes, Smokeless Tobacco, Cigars, and/or Pipes): No  Has patient been referred to the Quitline?: N/A patient is not a smoker  Patient has been referred for addiction treatment: N/A  [name], Belton Peplinski T, MSW, LCSWA 02/25/2016, 12:18 PM  [phone]

## 2016-02-25 NOTE — BHH Group Notes (Signed)
[facility] Group Notes:  (Nursing/MHT/Case Management/Adjunct)  Date:  02/25/2016  Time:  12:37 PM  Type of Therapy:  Psychoeducational Skills  Participation Level:  Active  Participation Quality:  Appropriate, Attentive, Sharing and Supportive  Affect:  Appropriate  Cognitive:  Appropriate  Insight:  Appropriate  Engagement in Group:  Supportive  Modes of Intervention:  Discussion and Education  Summary of Progress/Problems:  William Cruz 02/25/2016, 12:37 PM

## 2016-02-25 NOTE — Progress Notes (Signed)
Recreation Therapy Notes  INPATIENT RECREATION TR PLAN  Patient Details Name: William Cruz MRN: [id number] DOB: [birth year] Today's Date: 02/25/2016  Rec Therapy Plan Is patient appropriate for Therapeutic Recreation?: Yes Treatment times per week: At least once a week TR Treatment/Interventions: 1:1 session, Group participation (Comment) (Appropriate participation in daily recreation therapy tx)  Discharge Criteria Pt will be discharged from therapy if:: Treatment goals are met, Discharged Treatment plan/goals/alternatives discussed and agreed upon by:: Patient/family  Discharge Summary Short term goals set: See Care Plan Short term goals met: Complete Progress toward goals comments: One-to-one attended Which groups?: Leisure education, Coping skills, Social skills One-to-one attended: Self-esteem, stress management Reason goals not met: N/A Therapeutic equipment acquired: None Reason patient discharged from therapy: Discharge from hospital Pt/family agrees with progress & goals achieved: Yes Date patient discharged from therapy: 02/25/16   Leonette Monarch, LRT/CTRS 02/25/2016, 1:46 PM

## 2016-02-25 NOTE — BHH Suicide Risk Assessment (Signed)
B[facility] INPATIENT:  Family/Significant Other Suicide Prevention Education  Suicide Prevention Education:  Education Completed; Renee HarderKaylin Cruz (wife) [phone] has been identified by the patient as the family member/significant other with whom the patient will be residing, and identified as the person(s) who will aid the patient in the event of a mental health crisis (suicidal ideations/suicide attempt).  With written consent from the patient, the family member/significant other has been provided the following suicide prevention education, prior to the and/or following the discharge of the patient.  The suicide prevention education provided includes the following:  Suicide risk factors  Suicide prevention and interventions  National Suicide Hotline telephone number  [facility] assessment telephone number  [facility] Emergency Assistance 911  [facility] and/or Residential Mobile Crisis Unit telephone number  Request made of family/significant other to:  Remove weapons (e.g., guns, rifles, knives), all items previously/currently identified as safety concern.    Remove drugs/medications (over-the-counter, prescriptions, illicit drugs), all items previously/currently identified as a safety concern.  The family member/significant other verbalizes understanding of the suicide prevention education information provided.  The family member/significant other agrees to remove the items of safety concern listed above.  [name], [name], MSW, LCSWA 02/25/2016, 12:17 PM

## 2016-02-27 ENCOUNTER — Encounter: Payer: Self-pay | Admitting: Primary Care

## 2016-02-27 ENCOUNTER — Ambulatory Visit (INDEPENDENT_AMBULATORY_CARE_PROVIDER_SITE_OTHER): Payer: BLUE CROSS/BLUE SHIELD | Admitting: Primary Care

## 2016-02-27 VITALS — BP 114/72 | HR 83 | Temp 98.1°F | Ht 73.0 in | Wt 287.4 lb

## 2016-02-27 DIAGNOSIS — Z09 Encounter for follow-up examination after completed treatment for conditions other than malignant neoplasm: Secondary | ICD-10-CM

## 2016-02-27 NOTE — Progress Notes (Signed)
Pre visit review using our clinic review tool, if applicable. No additional management support is needed unless otherwise documented below in the visit note. 
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
## 2016-02-27 NOTE — Patient Instructions (Signed)
Continue therapy sessions and support group sessions.  Follow up with psychiatry as scheduled in 1 month.  Continue Fluoxetine 10 mg capsules.   You may use the Trazodone 50 mg at bedtime to aid with sleep.  Schedule a follow up appointment with Rene Kocheregina in 6 months.  It was a pleasure meeting you!

## 2016-03-03 ENCOUNTER — Telehealth: Payer: Self-pay | Admitting: Internal Medicine

## 2016-03-03 NOTE — Telephone Encounter (Signed)
Left detailed msg on VM per HIPAA  

## 2016-03-03 NOTE — Telephone Encounter (Signed)
Can you call pt, does not appear he went to ED. Is it a little or a lot of blood in his stool?

## 2016-03-03 NOTE — Telephone Encounter (Signed)
Patient Name: William Cruz DOB: 1986/04/15 Initial Comment Caller states woke up with diarrhea with blood - when coughing blood a little as well as in discharge from nose-- was given penicillin at an urg care to treat strep on friday Nurse Assessment Nurse: Charna Elizabethrumbull, RN, Lynden Angathy Date/Time (Eastern Time): 03/03/2016 11:34:32 AM Confirm and document reason for call. If symptomatic, describe symptoms. You must click the next button to save text entered. ---Caller states that he was started on antibiotics 3 days ago for Strep Throat. He coughed up several drops blood two times today and he developed blood in his diarrhea this morning. No severe breathing difficulty. No fever. Alert and responsive. Has the patient traveled out of the country within the last 30 days? ---No Does the patient have any new or worsening symptoms? ---Yes Will a triage be completed? ---Yes Related visit to physician within the last 2 weeks? ---Yes Does the PT have any chronic conditions? (i.e. diabetes, asthma, etc.) ---Yes List chronic conditions. ---Strep Throat, Depression Is this a behavioral health or substance abuse call? ---No Guidelines Guideline Title Affirmed Question Affirmed Notes Coughing Up Blood Patient sounds very sick or weak to the triager Final Disposition User Go to ED Now (or PCP triage) Charna Elizabethrumbull, RN, Cathy Referrals [facility] - ED Disagree/Comply: Comply

## 2016-03-04 NOTE — Telephone Encounter (Signed)
Pt's wife stated [facility] and was advised to cont. Medications and if he developed a rash stop penicillin and get seen to test for Mono

## 2016-03-04 NOTE — Telephone Encounter (Signed)
Left message on voicemail.

## 2016-03-05 NOTE — Telephone Encounter (Signed)
noted 
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
## 2016-04-03 ENCOUNTER — Encounter: Payer: Self-pay | Admitting: Primary Care

## 2016-04-03 ENCOUNTER — Ambulatory Visit (INDEPENDENT_AMBULATORY_CARE_PROVIDER_SITE_OTHER): Payer: BLUE CROSS/BLUE SHIELD | Admitting: Primary Care

## 2016-04-03 ENCOUNTER — Ambulatory Visit: Payer: Self-pay | Admitting: Primary Care

## 2016-04-03 VITALS — BP 122/72 | HR 75 | Temp 98.0°F | Ht 73.0 in | Wt 281.4 lb

## 2016-04-03 DIAGNOSIS — R112 Nausea with vomiting, unspecified: Secondary | ICD-10-CM

## 2016-04-03 MED ORDER — ONDANSETRON 4 MG PO TBDP: 4.0000 mg | ORAL_TABLET | Freq: Three times a day (TID) | ORAL | Status: AC | PRN

## 2016-04-03 NOTE — Patient Instructions (Signed)
You may take the Zofran (ondansetron) tablets as needed for nausea and vomiting. Melt 1 tablet in your mouth every 8 hours as needed.   Ensure you are staying hydrated with water to prevent dehydration.  If you develop diarrhea, then please do not take Imodium for the mean time as the virus will need to shed.  If you're unable to keep down water/fluids for greater than 24 hours, then go to the emergency department for rehydration.  Slowly advance your diet as tolerated.  It was a pleasure meeting you!  Nausea and Vomiting Nausea is a sick feeling that often comes before throwing up (vomiting). Vomiting is a reflex where stomach contents come out of your mouth. Vomiting can cause severe loss of body fluids (dehydration). Children and elderly adults can become dehydrated quickly, especially if they also have diarrhea. Nausea and vomiting are symptoms of a condition or disease. It is important to find the cause of your symptoms. CAUSES   Direct irritation of the stomach lining. This irritation can result from increased acid production (gastroesophageal reflux disease), infection, food poisoning, taking certain medicines (such as nonsteroidal anti-inflammatory drugs), alcohol use, or tobacco use.  Signals from the brain.These signals could be caused by a headache, heat exposure, an inner ear disturbance, increased pressure in the brain from injury, infection, a tumor, or a concussion, pain, emotional stimulus, or metabolic problems.  An obstruction in the gastrointestinal tract (bowel obstruction).  Illnesses such as diabetes, hepatitis, gallbladder problems, appendicitis, kidney problems, cancer, sepsis, atypical symptoms of a heart attack, or eating disorders.  Medical treatments such as chemotherapy and radiation.  Receiving medicine that makes you sleep (general anesthetic) during surgery. DIAGNOSIS Your caregiver may ask for tests to be done if the problems do not improve after a few  days. Tests may also be done if symptoms are severe or if the reason for the nausea and vomiting is not clear. Tests may include:  Urine tests.  Blood tests.  Stool tests.  Cultures (to look for evidence of infection).  X-rays or other imaging studies. Test results can help your caregiver make decisions about treatment or the need for additional tests. TREATMENT You need to stay well hydrated. Drink frequently but in small amounts.You may wish to drink water, sports drinks, clear broth, or eat frozen ice pops or gelatin dessert to help stay hydrated.When you eat, eating slowly may help prevent nausea.There are also some antinausea medicines that may help prevent nausea. HOME CARE INSTRUCTIONS   Take all medicine as directed by your caregiver.  If you do not have an appetite, do not force yourself to eat. However, you must continue to drink fluids.  If you have an appetite, eat a normal diet unless your caregiver tells you differently.  Eat a variety of complex carbohydrates (rice, wheat, potatoes, bread), lean meats, yogurt, fruits, and vegetables.  Avoid high-fat foods because they are more difficult to digest.  Drink enough water and fluids to keep your urine clear or pale yellow.  If you are dehydrated, ask your caregiver for specific rehydration instructions. Signs of dehydration may include:  Severe thirst.  Dry lips and mouth.  Dizziness.  Dark urine.  Decreasing urine frequency and amount.  Confusion.  Rapid breathing or pulse. SEEK IMMEDIATE MEDICAL CARE IF:   You have blood or brown flecks (like coffee grounds) in your vomit.  You have black or bloody stools.  You have a severe headache or stiff neck.  You are confused.  You have  severe abdominal pain.  You have chest pain or trouble breathing.  You do not urinate at least once every 8 hours.  You develop cold or clammy skin.  You continue to vomit for longer than 24 to 48 hours.  You have a  fever. MAKE SURE YOU:   Understand these instructions.  Will watch your condition.  Will get help right away if you are not doing well or get worse.   This information is not intended to replace advice given to you by your health care provider. Make sure you discuss any questions you have with your health care provider.   Document Released: 12/01/2005 Document Revised: 02/23/2012 Document Reviewed: 04/30/2011 Elsevier Interactive Patient Education [employer]2016 Elsevier Inc.

## 2016-04-03 NOTE — Progress Notes (Signed)
Subjective:    Patient ID: William Cruz, male    DOB: 1986/10/01, 30 y.o.   MRN: [id number]  HPI  William Cruz is a 30 year old male who presents today with a chief complaint of nausea and vomiting. His symptoms have been present since this morning although he's been feeling fatigued and achy for the past several days.   He's vomited 3-4 times today and had a low grade fever this morning. Denies diarrhea, abdominal pain, bloody stools. No one else in his household has had these symptoms, but he's noticed a few co-workers out of work lately. He ate crackers at 11 am today and vomited them up in 20 minutes. He's drinking water and some soda which he's able to keep down.   Review of Systems  Constitutional: Positive for fever and fatigue. Negative for unexpected weight change.  Gastrointestinal: Positive for nausea and vomiting. Negative for abdominal pain and diarrhea.  Musculoskeletal: Positive for myalgias.  Neurological: Negative for dizziness.       Past Medical History  Diagnosis Date  . Depression      Social History   Social History  . Marital Status: Married    Spouse Name: N/A  . Number of Children: N/A  . Years of Education: N/A   Occupational History  . Not on file.   Social History Main Topics  . Smoking status: Never Smoker   . Smokeless tobacco: Never Used  . Alcohol Use: No  . Drug Use: No  . Sexual Activity: Yes   Other Topics Concern  . Not on file   Social History Narrative    Past Surgical History  Procedure Laterality Date  . Cholecystectomy      Family History  Problem Relation Age of Onset  . Cancer Maternal Grandmother     breast  . Cancer Maternal Grandfather     prostate  . Diabetes Neg Hx   . Heart disease Neg Hx   . Stroke Neg Hx     No Known Allergies  Current Outpatient Prescriptions on File Prior to Visit  Medication Sig Dispense Refill  . FLUoxetine (PROZAC) 10 MG capsule Take 1 capsule (10 mg total) by mouth daily. 30  capsule 0  . Probiotic Product (PROBIOTIC DAILY PO) Take 1 tablet by mouth daily.    . traZODone (DESYREL) 50 MG tablet Take 1 tablet (50 mg total) by mouth at bedtime as needed for sleep. (Patient not taking: Reported on 02/27/2016) 30 tablet 0   No current facility-administered medications on file prior to visit.    BP 122/72 mmHg  Pulse 75  Temp(Src) 98 F (36.7 C) (Oral)  Ht  (1.854 m)  Wt 281 lb 6.4 oz (127.642 kg)  BMI 37.13 kg/m2  SpO2 97%    Objective:   Physical Exam  Constitutional: He appears well-nourished. He does not appear ill.  Neck: Neck supple.  Cardiovascular: Normal rate and regular rhythm.   Pulmonary/Chest: Effort normal and breath sounds normal.  Abdominal: Soft. Bowel sounds are normal. There is no tenderness.  Skin: Skin is warm and dry.          Assessment & Plan:  Viral Gastroenteritis:  Nausea and vomiting since this morning. Unable to tolerate solids, but is tolerating fluids. No alarm signs, abdominal pain.  Exam unremarkable today. Does not appear acutely dehydrated. Will treat with supportive measures as this is likely viral in nature. RX for Zofran provided today. Discussed importance of hydration. Advance diet slowly as  tolerated. ED precautions provided for dehydration. Stable today for home management.

## 2016-04-03 NOTE — Progress Notes (Signed)
Pre visit review using our clinic review tool, if applicable. No additional management support is needed unless otherwise documented below in the visit note. 

## 2016-04-21 ENCOUNTER — Ambulatory Visit (INDEPENDENT_AMBULATORY_CARE_PROVIDER_SITE_OTHER): Payer: BLUE CROSS/BLUE SHIELD | Admitting: Internal Medicine

## 2016-04-21 ENCOUNTER — Encounter: Payer: Self-pay | Admitting: Internal Medicine

## 2016-04-21 VITALS — BP 116/76 | HR 69 | Temp 98.0°F | Wt 283.0 lb

## 2016-04-21 DIAGNOSIS — A084 Viral intestinal infection, unspecified: Secondary | ICD-10-CM

## 2016-04-21 DIAGNOSIS — D510 Vitamin B12 deficiency anemia due to intrinsic factor deficiency: Secondary | ICD-10-CM | POA: Diagnosis not present

## 2016-04-21 LAB — CBC WITH DIFFERENTIAL/PLATELET
BASOS PCT: 0.6 % (ref 0.0–3.0)
Basophils Absolute: 0.1 10*3/uL (ref 0.0–0.1)
EOS PCT: 1.9 % (ref 0.0–5.0)
Eosinophils Absolute: 0.2 10*3/uL (ref 0.0–0.7)
HCT: 49.3 % (ref 39.0–52.0)
HEMOGLOBIN: 16.5 g/dL (ref 13.0–17.0)
LYMPHS ABS: 3.1 10*3/uL (ref 0.7–4.0)
Lymphocytes Relative: 31.4 % (ref 12.0–46.0)
MCHC: 33.5 g/dL (ref 30.0–36.0)
MCV: 88 fl (ref 78.0–100.0)
MONO ABS: 0.7 10*3/uL (ref 0.1–1.0)
MONOS PCT: 6.8 % (ref 3.0–12.0)
Neutro Abs: 5.9 10*3/uL (ref 1.4–7.7)
Neutrophils Relative %: 59.3 % (ref 43.0–77.0)
Platelets: 163 10*3/uL (ref 150.0–400.0)
RBC: 5.61 Mil/uL (ref 4.22–5.81)
RDW: 13 % (ref 11.5–15.5)
WBC: 9.9 10*3/uL (ref 4.0–10.5)

## 2016-04-21 LAB — COMPREHENSIVE METABOLIC PANEL
ALT: 29 U/L (ref 0–53)
AST: 20 U/L (ref 0–37)
Albumin: 4.1 g/dL (ref 3.5–5.2)
Alkaline Phosphatase: 75 U/L (ref 39–117)
BILIRUBIN TOTAL: 1.1 mg/dL (ref 0.2–1.2)
BUN: 10 mg/dL (ref 6–23)
CALCIUM: 9.2 mg/dL (ref 8.4–10.5)
CO2: 26 meq/L (ref 19–32)
CREATININE: 0.96 mg/dL (ref 0.40–1.50)
Chloride: 106 mEq/L (ref 96–112)
GFR: 98.62 mL/min (ref >60.00)
Glucose, Bld: 88 mg/dL (ref 70–99)
Potassium: 4 mEq/L (ref 3.5–5.1)
Sodium: 137 mEq/L (ref 135–145)
Total Protein: 7 g/dL (ref 6.0–8.3)

## 2016-04-21 LAB — FOLATE: Folate: 9.7 ng/mL (ref >5.9)

## 2016-04-21 LAB — VITAMIN B12: VITAMIN B 12: 296 pg/mL (ref 211–911)

## 2016-04-21 NOTE — Progress Notes (Signed)
Pre visit review using our clinic review tool, if applicable. No additional management support is needed unless otherwise documented below in the visit note. 

## 2016-04-21 NOTE — Progress Notes (Signed)
Subjective:    Patient ID: William Cruz, male    DOB: 04-20-1986, 30 y.o.   MRN: [id number]  HPI  Pt presents to the clinic today with c/o nausea, vomiting and diarrhea. He reports he woke up this morning, had a loose stool. He then go in the shower, and vomited. He vomited again once he got out of the shower. He denies fever, chills, abdominal pain or blood in his stool. He has not had sick contacts with similar symptoms. He denies changes in his diet. He and his wife went out to eat last night, but she is not having any symptoms. He has Zofran but he has not taken it this morning. He is also concerned, because he seems to get a episode of this at least once a month. It usually only last 24 hours. He did see Mayra Reel 4/20 for the same.  He also brings in a RX from his psychiatrist requesting CBC with diff, CMET, B12 and Folate, diagnosis- pernicious anemia. He denies fatigue, easy bruising, or shortness of breath.  Review of Systems      Past Medical History  Diagnosis Date  . Depression     Current Outpatient Prescriptions  Medication Sig Dispense Refill  . FLUoxetine (PROZAC) 10 MG capsule Take 1 capsule (10 mg total) by mouth daily. 30 capsule 0  . ondansetron (ZOFRAN ODT) 4 MG disintegrating tablet Take 1 tablet (4 mg total) by mouth every 8 (eight) hours as needed for nausea or vomiting. 20 tablet 0  . Probiotic Product (PROBIOTIC DAILY PO) Take 1 tablet by mouth daily.    . traZODone (DESYREL) 50 MG tablet Take 1 tablet (50 mg total) by mouth at bedtime as needed for sleep. 30 tablet 0   No current facility-administered medications for this visit.    No Known Allergies  Family History  Problem Relation Age of Onset  . Cancer Maternal Grandmother     breast  . Cancer Maternal Grandfather     prostate  . Diabetes Neg Hx   . Heart disease Neg Hx   . Stroke Neg Hx     Social History   Social History  . Marital Status: Married    Spouse Name: N/A  . Number of  Children: N/A  . Years of Education: N/A   Occupational History  . Not on file.   Social History Main Topics  . Smoking status: Never Smoker   . Smokeless tobacco: Never Used  . Alcohol Use: No  . Drug Use: No  . Sexual Activity: Yes   Other Topics Concern  . Not on file   Social History Narrative     Constitutional: Denies fever, malaise, fatigue, headache or abrupt weight changes.  Respiratory: Denies difficulty breathing, shortness of breath, cough or sputum production.   Cardiovascular: Denies chest pain, chest tightness, palpitations or swelling in the hands or feet.  Gastrointestinal: Pt reports nausea, vomiting and diarrhea. Denies abdominal pain, bloating, constipation, or blood in the stool.  GU: Denies urgency, frequency, pain with urination, burning sensation, blood in urine, odor or discharge.  No other specific complaints in a complete review of systems (except as listed in HPI above).  Objective:   Physical Exam  BP 116/76 mmHg  Pulse 69  Temp(Src) 98 F (36.7 C) (Oral)  Wt 283 lb (128.368 kg)  SpO2 99% Wt Readings from Last 3 Encounters:  04/21/16 283 lb (128.368 kg)  04/03/16 281 lb 6.4 oz (127.642 kg)  02/27/16 287 lb  6.4 oz (130.364 kg)    General: Appears his stated age, obese in NAD. Skin: Warm, dry and intact.  Cardiovascular: Normal rate and rhythm. S1,S2 noted.  No murmur, rubs or gallops noted.  Pulmonary/Chest: Normal effort and positive vesicular breath sounds. No respiratory distress. No wheezes, rales or ronchi noted.  Abdomen: Soft and mildly tender in the epigastric region. Normal bowel sounds. No distention or masses noted.  Neurological: Alert and oriented.   BMET    Component Value Date/Time   NA 141 02/19/2016 2248   K 3.7 02/19/2016 2248   CL 108 02/19/2016 2248   CO2 23 02/19/2016 2248   GLUCOSE 125* 02/19/2016 2248   BUN 10 02/19/2016 2248   CREATININE 1.03 02/19/2016 2248   CALCIUM 9.2 02/19/2016 2248   GFRNONAA >60  02/19/2016 2248   GFRAA >60 02/19/2016 2248    Lipid Panel     Component Value Date/Time   CHOL 202* 03/06/2014 1123   TRIG 126.0 03/06/2014 1123   HDL 42.20 03/06/2014 1123   CHOLHDL 5 03/06/2014 1123   VLDL 25.2 03/06/2014 1123   LDLCALC 135* 03/06/2014 1123    CBC    Component Value Date/Time   WBC 11.0* 02/19/2016 2248   RBC 5.42 02/19/2016 2248   HGB 16.2 02/19/2016 2248   HCT 46.9 02/19/2016 2248   PLT 187 02/19/2016 2248   MCV 86.5 02/19/2016 2248   MCH 29.9 02/19/2016 2248   MCHC 34.5 02/19/2016 2248   RDW 12.6 02/19/2016 2248    Hgb A1C Lab Results  Component Value Date   HGBA1C 5.0 02/21/2016         Assessment & Plan:   Viral Gastroenteritis:  Advised him to push fluids Clear liquid diet x 24 hours, then slowly progress to solid foods He has Zofran to take if needed Advised him to avoid antidiarrheals Encouraged him to make sure he cleans the toilet handle, sink knobs and door handles thoroughly Also discussed the importance of handwashing to prevent spread of viruses  Pernicious anemia:  CBC with diff, B12, Folate and CMET today [facility] in 6 months, sooner if needed

## 2016-04-21 NOTE — Patient Instructions (Signed)

## 2016-05-27 ENCOUNTER — Telehealth: Payer: Self-pay | Admitting: *Deleted

## 2016-05-27 ENCOUNTER — Telehealth: Payer: Self-pay | Admitting: Family

## 2016-05-27 ENCOUNTER — Encounter: Payer: Self-pay | Admitting: Family

## 2016-05-27 ENCOUNTER — Ambulatory Visit
Admission: RE | Admit: 2016-05-27 | Discharge: 2016-05-27 | Disposition: A | Discharge status: Home / Self Care | Payer: BLUE CROSS/BLUE SHIELD | Source: Ambulatory Visit | Attending: Family | Admitting: Family

## 2016-05-27 ENCOUNTER — Ambulatory Visit (INDEPENDENT_AMBULATORY_CARE_PROVIDER_SITE_OTHER): Payer: BLUE CROSS/BLUE SHIELD | Admitting: Family

## 2016-05-27 VITALS — BP 106/78 | HR 63 | Temp 98.2°F | Ht 72.0 in | Wt 279.4 lb

## 2016-05-27 DIAGNOSIS — R938 Abnormal findings on diagnostic imaging of other specified body structures: Secondary | ICD-10-CM | POA: Diagnosis not present

## 2016-05-27 DIAGNOSIS — R1032 Left lower quadrant pain: Secondary | ICD-10-CM | POA: Diagnosis present

## 2016-05-27 DIAGNOSIS — R109 Unspecified abdominal pain: Secondary | ICD-10-CM

## 2016-05-27 DIAGNOSIS — R935 Abnormal findings on diagnostic imaging of other abdominal regions, including retroperitoneum: Secondary | ICD-10-CM

## 2016-05-27 LAB — CBC WITH DIFFERENTIAL/PLATELET
Basophils Absolute: 0.1 10*3/uL (ref 0.0–0.1)
Basophils Relative: 0.5 % (ref 0.0–3.0)
EOS PCT: 1.3 % (ref 0.0–5.0)
Eosinophils Absolute: 0.2 10*3/uL (ref 0.0–0.7)
HCT: 50.5 % (ref 39.0–52.0)
Hemoglobin: 17.1 g/dL — ABNORMAL HIGH (ref 13.0–17.0)
LYMPHS ABS: 3.8 10*3/uL (ref 0.7–4.0)
Lymphocytes Relative: 32.9 % (ref 12.0–46.0)
MCHC: 33.8 g/dL (ref 30.0–36.0)
MCV: 87.1 fl (ref 78.0–100.0)
MONOS PCT: 6.4 % (ref 3.0–12.0)
Monocytes Absolute: 0.7 10*3/uL (ref 0.1–1.0)
NEUTROS PCT: 58.9 % (ref 43.0–77.0)
Neutro Abs: 6.8 10*3/uL (ref 1.4–7.7)
Platelets: 198 10*3/uL (ref 150.0–400.0)
RBC: 5.8 Mil/uL (ref 4.22–5.81)
RDW: 13.4 % (ref 11.5–15.5)
WBC: 11.5 10*3/uL — ABNORMAL HIGH (ref 4.0–10.5)

## 2016-05-27 LAB — COMPREHENSIVE METABOLIC PANEL
ALBUMIN: 4.3 g/dL (ref 3.5–5.2)
ALK PHOS: 89 U/L (ref 39–117)
ALT: 30 U/L (ref 0–53)
AST: 20 U/L (ref 0–37)
BUN: 9 mg/dL (ref 6–23)
CO2: 24 mEq/L (ref 19–32)
Calcium: 9.4 mg/dL (ref 8.4–10.5)
Chloride: 106 mEq/L (ref 96–112)
Creatinine, Ser: 1 mg/dL (ref 0.40–1.50)
GFR: 94.02 mL/min (ref >60.00)
GLUCOSE: 69 mg/dL — AB (ref 70–99)
POTASSIUM: 4 meq/L (ref 3.5–5.1)
Sodium: 140 mEq/L (ref 135–145)
TOTAL PROTEIN: 7.1 g/dL (ref 6.0–8.3)
Total Bilirubin: 1.8 mg/dL — ABNORMAL HIGH (ref 0.2–1.2)

## 2016-05-27 LAB — LIPASE: LIPASE: 21 U/L (ref 11.0–59.0)

## 2016-05-27 MED ORDER — IOPAMIDOL (ISOVUE-300) INJECTION 61%
100.0000 mL | Freq: Once | INTRAVENOUS | Status: AC | PRN
  Administered 2016-05-27: 100 mL via INTRAVENOUS

## 2016-05-27 NOTE — Progress Notes (Signed)
Subjective:    Patient ID: William Cruz Lafuente, male    DOB: 18-Aug-1986, 30 y.o.   MRN: [id number]   William Cruz is a 30 y.o. male who presents today for an acute visit.    HPI Comments: Patient here for evaluation of vomiting, nausea, and diarrhea. Diarrhea, chills, achiness started couple hours after eating dinner. Had [employer] for dinner. Non bloody diarrhea and emesis. Endorses stomach cramps. Continued  to throw up today 3 times today. Endorses fever this morning which improved with ibuprofen. Has been taking Zofran,  probiotic with mild relief. Has had some water to drink, no food today. No appetite.  Per chart review, had viral gastroenteritis 2 months ago. He states 'this is similar but this time, came on faster.'  Patient states that a history of many years abdominal pain and cramping, quite severe in the mornings.No known lactose or gluten allergy. He would like second opinion by GI.   History of cholecystectomy.  Past Medical History  Diagnosis Date  . Depression    Allergies: Review of patient's allergies indicates no known allergies. Current Outpatient Prescriptions on File Prior to Visit  Medication Sig Dispense Refill  . FLUoxetine (PROZAC) 10 MG capsule Take 1 capsule (10 mg total) by mouth daily. 30 capsule 0  . ondansetron (ZOFRAN ODT) 4 MG disintegrating tablet Take 1 tablet (4 mg total) by mouth every 8 (eight) hours as needed for nausea or vomiting. 20 tablet 0  . Probiotic Product (PROBIOTIC DAILY PO) Take 1 tablet by mouth daily.    . traZODone (DESYREL) 50 MG tablet Take 1 tablet (50 mg total) by mouth at bedtime as needed for sleep. 30 tablet 0   No current facility-administered medications on file prior to visit.    Social History  Substance Use Topics  . Smoking status: Never Smoker   . Smokeless tobacco: Never Used  . Alcohol Use: No    Review of Systems  Constitutional: Positive for chills. Negative for fever.  HENT: Negative for congestion and sinus  pressure.   Respiratory: Negative for cough, shortness of breath and wheezing.   Cardiovascular: Negative for chest pain and palpitations.  Gastrointestinal: Positive for nausea, vomiting, abdominal pain and diarrhea.  Genitourinary: Negative for dysuria.  Musculoskeletal: Positive for myalgias.  Skin: Negative for rash.  Neurological: Negative for dizziness and headaches.  Hematological: Negative for adenopathy.      Objective:    BP 106/78 mmHg  Pulse 63  Temp(Src) 98.2 F (36.8 C)  Ht 6' (1.829 m)  Wt 279 lb 6.4 oz (126.735 kg)  BMI 37.89 kg/m2  SpO2 98%   Physical Exam  Constitutional: He appears well-developed and well-nourished.  Cardiovascular: Regular rhythm and normal heart sounds.   Pulmonary/Chest: Effort normal and breath sounds normal. No respiratory distress. He has no wheezes. He has no rhonchi. He has no rales.  Abdominal: Soft. Normal appearance and bowel sounds are normal. He exhibits no distension, no fluid wave, no ascites and no mass. There is tenderness in the left lower quadrant. There is no rigidity, no rebound, no guarding, no CVA tenderness and no tenderness at McBurney's point.  No suprapubic tenderness.   Lymphadenopathy:       Head (left side): No submandibular and no preauricular adenopathy present.  Neurological: He is alert.  Skin: Skin is warm and dry.  Psychiatric: He has a normal mood and affect. His speech is normal and behavior is normal.  Vitals reviewed.      Assessment & Plan:  1. LLQ abdominal pain Concern for diverticulitis. Alternatively considering gastroenteritis.Pending work up.   - CT Abdomen Pelvis W Contrast; Future - CBC with Differential/Platelet; Future - Comprehensive metabolic panel; Future - Lipase; Future - CBC with Differential/Platelet - Comprehensive metabolic panel - Lipase  2. Abdominal cramping Chronic. Patient would like a second opinion from GI. No known lactose or gluten allergy.  - Ambulatory  referral to Gastroenterology     I am having Mr. Hjort maintain his Probiotic Product (PROBIOTIC DAILY PO), FLUoxetine, traZODone, and ondansetron.   No orders of the defined types were placed in this encounter.     Start medications as prescribed and explained to patient on After Visit Summary ( AVS). Risks, benefits, and alternatives of the medications and treatment plan prescribed today were discussed, and patient expressed understanding.   Education regarding symptom management and diagnosis given to patient.   Follow-up:Plan follow-up and return precautions given if any worsening symptoms or change in condition.   Continue to follow with Nicki Reaper, NP for routine health maintenance.   William Freestone and I agreed with plan.   Rennie Plowman, FNP

## 2016-05-27 NOTE — Telephone Encounter (Signed)
CMA called patient with results.

## 2016-05-27 NOTE — Patient Instructions (Signed)
Labs.  CT abdomen.   Referral to GI  If there is no improvement in your symptoms, or if there is any worsening of symptoms, or if you have any additional concerns, please return for re-evaluation; or, if we are closed, consider going to the Emergency Room for evaluation if symptoms urgent.

## 2016-05-27 NOTE — Telephone Encounter (Signed)
Patient has requested test results from 05-27-16 CT Scan.  [phone]

## 2016-06-01 NOTE — Telephone Encounter (Signed)
Erroneous encounter

## 2016-08-28 ENCOUNTER — Encounter: Payer: Self-pay | Admitting: Internal Medicine

## 2016-08-28 ENCOUNTER — Ambulatory Visit: Payer: Self-pay | Admitting: Internal Medicine
